# Patient Record
Sex: Male | Born: 1940
Health system: Southern US, Community
[De-identification: ages and names within clinical notes are randomized; demographics above are authoritative.]

## PROBLEM LIST (undated history)

## (undated) ENCOUNTER — Encounter: Attending: Internal Medicine | Primary: Internal Medicine

## (undated) ENCOUNTER — Encounter

## (undated) ENCOUNTER — Telehealth

## (undated) ENCOUNTER — Encounter: Attending: Registered" | Primary: Registered"

## (undated) ENCOUNTER — Ambulatory Visit: Payer: MEDICARE | Attending: Internal Medicine | Primary: Internal Medicine

## (undated) ENCOUNTER — Ambulatory Visit: Attending: Pharmacist | Primary: Pharmacist

## (undated) ENCOUNTER — Ambulatory Visit

## (undated) DIAGNOSIS — L570 Actinic keratosis: Secondary | ICD-10-CM

## (undated) DIAGNOSIS — I6529 Occlusion and stenosis of unspecified carotid artery: Secondary | ICD-10-CM

## (undated) DIAGNOSIS — K635 Polyp of colon: Secondary | ICD-10-CM

## (undated) DIAGNOSIS — E785 Hyperlipidemia, unspecified: Secondary | ICD-10-CM

## (undated) DIAGNOSIS — J449 Chronic obstructive pulmonary disease, unspecified: Secondary | ICD-10-CM

## (undated) DIAGNOSIS — M720 Palmar fascial fibromatosis [Dupuytren]: Secondary | ICD-10-CM

## (undated) DIAGNOSIS — R7303 Prediabetes: Secondary | ICD-10-CM

## (undated) DIAGNOSIS — R7302 Impaired glucose tolerance (oral): Secondary | ICD-10-CM

## (undated) DIAGNOSIS — I10 Essential (primary) hypertension: Secondary | ICD-10-CM

## (undated) DIAGNOSIS — I2510 Atherosclerotic heart disease of native coronary artery without angina pectoris: Secondary | ICD-10-CM

## (undated) DIAGNOSIS — C61 Malignant neoplasm of prostate: Secondary | ICD-10-CM

## (undated) DIAGNOSIS — J329 Chronic sinusitis, unspecified: Secondary | ICD-10-CM

## (undated) DIAGNOSIS — M1990 Unspecified osteoarthritis, unspecified site: Secondary | ICD-10-CM

## (undated) DIAGNOSIS — R0600 Dyspnea, unspecified: Secondary | ICD-10-CM

## (undated) DIAGNOSIS — H9190 Unspecified hearing loss, unspecified ear: Secondary | ICD-10-CM

## (undated) DIAGNOSIS — J309 Allergic rhinitis, unspecified: Secondary | ICD-10-CM

## (undated) DIAGNOSIS — E119 Type 2 diabetes mellitus without complications: Secondary | ICD-10-CM

## (undated) DIAGNOSIS — G629 Polyneuropathy, unspecified: Secondary | ICD-10-CM

## (undated) HISTORY — DX: Unspecified hearing loss, unspecified ear: H91.90

## (undated) HISTORY — DX: Chronic obstructive pulmonary disease, unspecified: J44.9

## (undated) HISTORY — PX: BACK SURGERY: SHX140

## (undated) HISTORY — DX: Unspecified osteoarthritis, unspecified site: M19.90

## (undated) HISTORY — DX: Polyneuropathy, unspecified: G62.9

## (undated) HISTORY — DX: Chronic sinusitis, unspecified: J32.9

## (undated) HISTORY — PX: KNEE SURGERY: SHX244

## (undated) HISTORY — PX: SHOULDER SURGERY: SHX246

## (undated) HISTORY — PX: TOOTH EXTRACTION: SUR596

## (undated) HISTORY — DX: Allergic rhinitis, unspecified: J30.9

## (undated) HISTORY — DX: Occlusion and stenosis of unspecified carotid artery: I65.29

## (undated) HISTORY — PX: NECK SURGERY: SHX720

## (undated) HISTORY — DX: Polyp of colon: K63.5

## (undated) HISTORY — DX: Essential (primary) hypertension: I10

## (undated) HISTORY — DX: Actinic keratosis: L57.0

## (undated) HISTORY — DX: Impaired glucose tolerance (oral): R73.02

## (undated) HISTORY — PX: PROSTATE SURGERY: SHX751

## (undated) HISTORY — DX: Hyperlipidemia, unspecified: E78.5

## (undated) HISTORY — DX: Malignant neoplasm of prostate: C61

---

## 1999-01-15 ENCOUNTER — Ambulatory Visit (HOSPITAL_COMMUNITY): Admission: RE | Admit: 1999-01-15 | Discharge: 1999-01-15 | Payer: Self-pay | Admitting: Gastroenterology

## 1999-01-15 ENCOUNTER — Encounter (INDEPENDENT_AMBULATORY_CARE_PROVIDER_SITE_OTHER): Payer: Self-pay

## 1999-08-01 ENCOUNTER — Encounter: Admission: RE | Admit: 1999-08-01 | Discharge: 1999-08-01 | Payer: Self-pay | Admitting: Family Medicine

## 1999-08-01 ENCOUNTER — Encounter: Payer: Self-pay | Admitting: Family Medicine

## 2000-09-05 ENCOUNTER — Ambulatory Visit (HOSPITAL_BASED_OUTPATIENT_CLINIC_OR_DEPARTMENT_OTHER): Admission: RE | Admit: 2000-09-05 | Discharge: 2000-09-05 | Payer: Self-pay | Admitting: Orthopedic Surgery

## 2002-06-30 ENCOUNTER — Encounter: Payer: Self-pay | Admitting: Neurosurgery

## 2002-06-30 ENCOUNTER — Observation Stay (HOSPITAL_COMMUNITY): Admission: RE | Admit: 2002-06-30 | Discharge: 2002-07-01 | Payer: Self-pay | Admitting: Neurosurgery

## 2003-05-30 ENCOUNTER — Encounter: Admission: RE | Admit: 2003-05-30 | Discharge: 2003-08-28 | Payer: Self-pay | Admitting: Family Medicine

## 2003-09-26 ENCOUNTER — Encounter (INDEPENDENT_AMBULATORY_CARE_PROVIDER_SITE_OTHER): Payer: Self-pay | Admitting: Specialist

## 2003-09-26 ENCOUNTER — Ambulatory Visit (HOSPITAL_COMMUNITY): Admission: RE | Admit: 2003-09-26 | Discharge: 2003-09-26 | Payer: Self-pay | Admitting: Gastroenterology

## 2003-12-01 ENCOUNTER — Ambulatory Visit (HOSPITAL_BASED_OUTPATIENT_CLINIC_OR_DEPARTMENT_OTHER): Admission: RE | Admit: 2003-12-01 | Discharge: 2003-12-01 | Payer: Self-pay | Admitting: Orthopedic Surgery

## 2004-12-15 ENCOUNTER — Encounter: Admission: RE | Admit: 2004-12-15 | Discharge: 2004-12-15 | Payer: Self-pay | Admitting: Neurosurgery

## 2008-03-31 ENCOUNTER — Ambulatory Visit (HOSPITAL_BASED_OUTPATIENT_CLINIC_OR_DEPARTMENT_OTHER): Admission: RE | Admit: 2008-03-31 | Discharge: 2008-03-31 | Payer: Self-pay | Admitting: Orthopedic Surgery

## 2008-06-24 DIAGNOSIS — C61 Malignant neoplasm of prostate: Secondary | ICD-10-CM

## 2008-06-24 HISTORY — DX: Malignant neoplasm of prostate: C61

## 2008-09-01 ENCOUNTER — Encounter: Admission: RE | Admit: 2008-09-01 | Discharge: 2008-09-01 | Payer: Self-pay | Admitting: Family Medicine

## 2008-12-07 ENCOUNTER — Ambulatory Visit (HOSPITAL_COMMUNITY): Admission: RE | Admit: 2008-12-07 | Discharge: 2008-12-07 | Payer: Self-pay | Admitting: Urology

## 2008-12-27 ENCOUNTER — Ambulatory Visit: Admission: RE | Admit: 2008-12-27 | Discharge: 2009-03-02 | Payer: Self-pay | Admitting: Radiation Oncology

## 2009-02-02 ENCOUNTER — Encounter (INDEPENDENT_AMBULATORY_CARE_PROVIDER_SITE_OTHER): Payer: Self-pay | Admitting: Urology

## 2009-02-02 ENCOUNTER — Inpatient Hospital Stay (HOSPITAL_COMMUNITY): Admission: RE | Admit: 2009-02-02 | Discharge: 2009-02-03 | Payer: Self-pay | Admitting: Urology

## 2010-09-29 LAB — BASIC METABOLIC PANEL
BUN: 19 mg/dL (ref 6–23)
CO2: 29 mEq/L (ref 19–32)
Calcium: 9.3 mg/dL (ref 8.4–10.5)
Creatinine, Ser: 0.98 mg/dL (ref 0.4–1.5)
GFR calc non Af Amer: >60 mL/min (ref >60)
Glucose, Bld: 101 mg/dL — ABNORMAL HIGH (ref 70–99)
Sodium: 139 mEq/L (ref 135–145)

## 2010-09-29 LAB — CBC
MCHC: 33.9 g/dL (ref 30.0–36.0)
Platelets: 235 10*3/uL (ref 150–400)
RDW: 12.7 % (ref 11.5–15.5)

## 2010-09-29 LAB — TYPE AND SCREEN: Antibody Screen: NEGATIVE

## 2010-11-06 NOTE — Discharge Summary (Signed)
NAMEBANDY, [name]               ACCOUNT NO.:  [id number]   MEDICAL RECORD NO.:  [id number]          PATIENT TYPE:  INP   LOCATION:  1404                         FACILITY:  [facility]   PHYSICIAN:  Heloise Purpura, MD      DATE OF BIRTH:  [birth year]   DATE OF ADMISSION:  02/02/2009  DATE OF DISCHARGE:  02/03/2009                               DISCHARGE SUMMARY   ADMISSION DIAGNOSIS:  Clinically localized adenocarcinoma of the  prostate.   DISCHARGE DIAGNOSIS:  Clinically localized adenocarcinoma of the  prostate (clinical stage [id number]).   PROCEDURES:  1. Robotic assisted laparoscopic radical prostatectomy (bilateral      nerve-sparing).  2. Bilateral pelvic lymphadenectomy.   History and physical for full details please see admission history and  physical.  Briefly, [name] is a 70 year old gentleman who was found  to have clinically localized adenocarcinoma of prostate.  After careful  consideration regarding management options for treatment he elected to  proceed with surgical therapy and a robotic assisted laparoscopic  radical prostatectomy.   HOSPITAL COURSE:  On February 02, 2009 he was taken to the operating room  and underwent the above-named procedures which he tolerated well without  complications.  Postoperatively he was able to be transferred to a  regular hospital room following recovery from anesthesia.  He was able  to begin ambulation that evening.  He remained hemodynamically stable.  His postoperative hematocrit was 36.7.  On the morning of postoperative  day #1 his hematocrit was also found to be stable at 34.8.  He  maintained excellent urine output with minimal output from his pelvic  drain. Therefore, the pelvic drain was removed.  He was placed on a  clear liquid diet and continued to ambulate.  He was reevaluated on the  afternoon of postoperative day #1.  He was able to tolerate his clear  liquid diet and his urine output was excellent and he had no  complaints  of pain.  Therefore, he was felt to be stable for discharge as he had  met all discharge criteria.   DISPOSITION:  Home.   DISCHARGE MEDICATIONS:  He was instructed to resume his regular home  medications consisting of Cymbalta, gabapentin, and pravastatin.  In  addition he was provided a prescription for Darvocet to use as needed  for pain and told to use Colace as a stool softener.  He was also  provided a prescription for Cipro to begin 1 day prior to removal of his  Foley catheter.   DISCHARGE INSTRUCTIONS:  He was instructed to be ambulatory but  specifically told to refrain from any heavy lifting, strenuous activity  or driving.  He was instructed on routine Foley catheter care and told  to gradually advance his diet over the course of the next few days.   FOLLOW-UP:  He will follow up in 1 week for removal of Foley catheter.      Delia Chimes, NP      Heloise Purpura, MD  Electronically Signed    MA/MEDQ  D:  02/03/2009  T:  02/03/2009  Job:  [id number]

## 2010-11-06 NOTE — Op Note (Signed)
[name], Danny               ACCOUNT NO.:  [id number]   MEDICAL RECORD NO.:  [id number]          PATIENT TYPE:  AMB   LOCATION:  DSC                          FACILITY:  [facility]   PHYSICIAN:  Katy Fitch. Sypher, M.D. DATE OF BIRTH:  1941/02/26   DATE OF PROCEDURE:  DATE OF DISCHARGE:                               OPERATIVE REPORT   PREOPERATIVE DIAGNOSES:  1. Dupuytren contracture, left palm involving pretendinous fibers to      long finger, ring finger, and 2 natatory ligament cords in the      thumb index webspace.  2. Stenosing tenosynovitis of index, long, ring, and small fingers.   POSTOPERATIVE DIAGNOSES:  1. Dupuytren contracture, left palm involving pretendinous fibers to      long finger, ring finger, and 2 natatory ligament cords in the      thumb index webspace.  2. Stenosing tenosynovitis of index, long, ring, and small fingers.   OPERATIONS:  1. His resection of Dupuytren palmar fibromatosis contracture of left      long finger and palm.  2. Resection of Dupuytren contracture, left ring finger and palm.  3. A needle aponeurotomy to release a pair of natatory ligament;      contracture cords; thumb index webspace, left palm.  4. Release of left index finger A1 pulley.  5. Release of left long finger A1 pulley.  6. Release of left ring finger A1 pulley with synovectomy of flexor      tendons.  7. Injection of Depo-Medrol and lidocaine into distal flexor sheath of      ring finger due to distal stenosing tenosynovitis beneath C1 and A2      pulleys.  8. Release of left small finger A1 pulley.   OPERATING SURGEON:  Katy Fitch. Sypher, MD   ASSISTANT:  Danny Reeks Dasnoit, PA-C   ANESTHESIA:  Left infraclavicular block supplemented by IV sedation.   SUPERVISING ANESTHESIOLOGIST:  Zenon Mayo, MD   INDICATIONS:  [name] is a 70 year old retired gentleman referred  through the courtesy of Dr. Catha Gosselin for evaluation of Dupuytren  contracture and  multiple trigger fingers.  He has failed nonoperative  measures.   [name] has a severe Dupuytren diathesis with nodular fibromatosis in  the palm.  He had a thumb index web contracture due to natatory ligament  bands and extensive nodular disease in the pretendinous fibers of the  long and ring fingers.  In addition, he had active triggering of all of  his fingers of the left hand.   Due to a failure to respond to nonoperative measures.  He was brought to  the operating at this time for release of his A1 pulleys of all fingers  and correction of his nodular palmar fibromatosis.   Due to the magnitude of his nodules at the long and ring fingers, we  will resect these through our incisions to release the A1 pulleys.  In  the thumb index web space, we will dress his cord with natatory ligament  release with needle aponeurotomy.  Preoperatively, he was advised the  potential risks and  benefits of surgery.  He understands that with any  dissection in this region, he has a small possibility of a neurovascular  injury.  We will vigorously protect his neurovascular structures  throughout the procedure.   After informed consent, he was brought to the operating at this time.  He does understand that Dupuytren disease is progressive and will recur  overtime.   PROCEDURE:  [name] was brought to the operating room and placed  in supine position up on the operating room table.   Following an anesthesia consult by Dr. Sampson Goon, a left  infraclavicular block was placed without complication.   [name] was brought to room 6, placed in supine position up on the  operating room table and in Dr. Jarrett Ables direct supervision, IV  sedation provided.   The left arm was prepped with Betadine soap solution and sterilely  draped.  A pneumatic tourniquet was applied proximal to brachium.   On exsanguination of left arm with Esmarch bandage, the arterial  tourniquet was inflated to 220  mmHg.  The procedure commenced with use  of an 18-gauge needle to release a pair of tight cords and natatory  ligament.  A total of 3 punctures were utilized to sequentially  segmentally release the cord.  This completely relieved the tented  contracture.   The A1 pulleys of the index, long ring, and small fingers were dressed  through an individual oblique incisions paralleling the distal palmar  crease.  Subcutaneous tissues were meticulously dissected removing the  pretendinous fibers to each finger.  For the long and ring fingers, very  large nodules of fibromatosis were circumferentially dissected deep to  the dermis, taking care to protect the neurovascular bundles.  After  these were excised.  The pitting of the palm was relieved.  The A1  pulleys of the index long ring and small fingers were invested in dense  fibrotic and edematous inflammatory tissue.  This was cleared with a  Glorious Peach followed by release of the pulleys with scissors.  The flexor  tendon was delivered.  The index and long tendons were edematous, but  otherwise normal.  The ring finger flexor tendon had profound synovitis  that was relieved with scissors and rongeur dissection.  After  synovectomy from the level of the mid palm to the A2 pulley, there was  still triggering between the A2 pulley and the C1 pulley.  Rather than  making a second incision and resecting one of the flips of the  superficialis.  I elected to thread a 22-gauge needle deep to the A2  pulley and inject cortisone distally in the flexor sheath.   We will attempt to treat this distal triggering phenomenon with anti-  inflammatory medication initially.   In the small finger, the synovitis was less prominent.  The pulley was  released and full passive motion recovered.   After hemostasis achieved, all wounds were repaired with intradermal  through a Prolene.  A compressive dressing was applied with Xeroflo  sterile gauze and an Ace  bandage.   We will encourage [name] to begin immediate range of motion exercise  once block wears off.   I will see him back and follow up in the office for interval follow up  in 1 week or sooner if any problems.      Katy Fitch Sypher, M.D.  Electronically Signed     RVS/MEDQ  D:  03/31/2008  T:  04/01/2008  Job:  [id number]   cc:  Anna Genre Little, M.D.

## 2010-11-06 NOTE — Op Note (Signed)
[name], [name]               ACCOUNT NO.:  [id number]   MEDICAL RECORD NO.:  [id number]          PATIENT TYPE:  INP   LOCATION:  1404                         FACILITY:  [facility]   PHYSICIAN:  Heloise Purpura, MD      DATE OF BIRTH:  [birth year]   DATE OF PROCEDURE:  02/02/2009  DATE OF DISCHARGE:                               OPERATIVE REPORT   PREOPERATIVE DIAGNOSIS:  Clinically localized adenocarcinoma of the  prostate (cT1c Nx Mx)   POSTOPERATIVE DIAGNOSIS:  Clinically localized adenocarcinoma of the  prostate (clinical stage T1c NX MX).   PROCEDURE:  1. Robotic-assisted laparoscopic radical prostatectomy (bilateral      nerve sparing).  2. Bilateral pelvic lymphadenectomy.   SURGEON:  Dr. Heloise Purpura.   ASSISTANT:  Delia Chimes, nurse practitioner.   ANESTHESIA:  General.   COMPLICATIONS:  None.   ESTIMATED BLOOD LOSS:  150 mL.   SPECIMENS:  1. Prostate and seminal vesicles.  2. Bladder neck margin.  3. Right pelvic lymph nodes.  4. Left pelvic lymph nodes.   DISPOSITION:  Specimen to pathology.   DRAINS:  1. 20-French coude catheter.  2. #19 Blake pelvic drain.   INDICATIONS:  [name] is a 70 year old gentleman with clinically  localized adenocarcinoma of the prostate.  After a thorough discussion  regarding management options for treatment, he elected to proceed with  surgical therapy and the above procedure.  The potential risks,  complications, and alternative treatment options were discussed in  detail and informed consent was obtained.   DESCRIPTION OF PROCEDURE:  The patient was taken to the operating room  and a general anesthetic was administered.  He was given preoperative  antibiotics, placed in the dorsal lithotomy position, and prepped and  draped in the usual sterile fashion.  Next, a preoperative time-out was  performed.  A Foley catheter was then inserted into the bladder and a  site was selected just above the umbilicus in the midline  for placement  of the camera port.  This was placed using a standard open Hasson  technique, which allowed entry into the peritoneal cavity under direct  vision and without difficulty.  A 12-mm port was then placed and a  pneumoperitoneum was established.  With the 0 degree lens, the abdomen  was inspected and there was no evidence for any intra-abdominal injuries  or other abnormalities.  The remaining ports were then placed with 8-mm  robotic ports placed in the right lower quadrant, left lower quadrant,  and far left lower quadrant.  A 5-mm port was placed between the camera  port and the right robotic port and a 12-mm port was placed in the far  right lateral abdominal wall for laparoscopic assistance.  All ports  were placed under direct vision and without difficulty.  The surgical  cart was then docked.  With the aid of the cautery scissors, the bladder  was reflected posteriorly to allow entry into the space of Retzius and  identification of the endopelvic fascia and prostate.  The endopelvic  fascia was then incised in the apex back to the base  of the prostate  bilaterally and the underlying levator muscle fibers were swept  laterally off the prostate, thereby isolating the dorsal vein complex  which was then stapled and divided with a 45-mm Flex-ETS stapler.  The  bladder neck was identified with the aid of Foley catheter manipulation  and was divided serially, thereby exposing the Foley catheter.  A  catheter balloon was deflated and the catheter was brought into the  operative field and used to retract the prostate anteriorly.  This then  exposed the posterior bladder neck which was divided and dissection  proceeded between the prostate and bladder until the vasa deferentia and  seminal vesicles were identified.  The vasa deferentia were isolated,  divided and lifted anteriorly and the seminal vesicles were dissected  down to their tips with care to control the seminal vesicle  arterial  blood supply and then lifted anteriorly.  The space between  Denonvilliers fascia and the anterior rectum was then bluntly developed,  thereby isolating the vascular pedicles of the prostate.  The lateral  prostatic fascia was incised sharply bilaterally, allowing the  neurovascular bundles to be released and the vascular pedicles of the  prostate were then ligated with Hem-o-lok clips above the level of the  neurovascular bundles and divided with sharp cold scissor dissection.  The neurovascular bundles were then swept off the apex of the prostate  and urethra and then urethra was sharply transected.  This allowed the  specimen to be disarticulated.  Examination of the bladder neck did  reveal some hypertrophic tissue vs possible prostate tissue.  This was,  therefore, excised and sent as a separate bladder neck margin.  The  pelvis was then copiously irrigated and hemostasis was ensured.  There  was no evidence for a rectal injury.  Attention then turned to the right  pelvic sidewall.  The fibrofatty tissue between the external iliac vein,  confluence of the iliac vessels, hypogastric artery, and Cooper's  ligament was dissected free from the pelvic sidewall with care to  preserve the obturator nerve.  Hem-o-lok clips were used for  lymphostasis and hemostasis.  An identical procedure was then performed  on the contralateral side.  Both lymphatic packets were passed off for  permanent pathologic analysis.  Attention then turned to the urethral  anastomosis.  A 2-0 Vicryl slip-knot was placed at the 6 o'clock  position between Denonvilliers fascia, the posterior bladder neck, and  the posterior urethra to reapproximate these structures.  A double-armed  3-0 Monocryl suture was then used to provide tension-free anastomosis of  the bladder neck and urethra.  A new 20-French coude catheter was then  inserted into the bladder and irrigated.  There were no blood clots  within the  bladder and the anastomosis appeared to be watertight.  A #  19 Blake drain was then brought through the left robotic port,  appropriately positioned in the pelvis.  It was secured to the skin with  a nylon suture.  The surgical cart was then undocked.  The right lateral  12-mm port site was closed with a 0 Vicryl suture placed  laparoscopically and all remaining ports were removed under direct  vision.  The prostate specimen was removed intact within the Endopouch  retrieval bag via the periumbilical port site and this fascial opening  was then closed with a running 0 Vicryl suture.  All port sites were  injected with 0.25% Marcaine and reapproximated at the skin level with  staples.  Sterile  dressings were applied.  The patient appeared to  tolerate the procedure well and without complications.  He was able to  be extubated and transferred to the recovery unit in satisfactory  condition.      Heloise Purpura, MD  Electronically Signed     LB/MEDQ  D:  02/02/2009  T:  02/02/2009  Job:  [id number]

## 2010-11-09 NOTE — Op Note (Signed)
NAME:  [name], [name]                         ACCOUNT NO.:  [id number]   MEDICAL RECORD NO.:  [id number]                   PATIENT TYPE:  AMB   LOCATION:  ENDO                                 FACILITY:  [facility]   PHYSICIAN:  John C. Madilyn Fireman, M.D.                 DATE OF BIRTH:  Dec 24, 1940   DATE OF PROCEDURE:  09/26/2003  DATE OF DISCHARGE:                                 OPERATIVE REPORT   PROCEDURE:  Colonoscopy with polypectomy.   INDICATIONS FOR PROCEDURE:  History of adenomatous colon polyps.   DESCRIPTION OF PROCEDURE:  The patient was placed in the left lateral  decubitus position and placed on the pulse monitor with continuous low-flow  oxygen delivered by nasal cannula.  He was sedated with 75 mcg IV fentanyl  and 8 mg IV Versed.  The Olympus video colonoscope was inserted into the  rectum and advanced to the cecum, confirmed by transillumination at  McBurney's point and visualization of the ileocecal valve and appendiceal  orifice.  The prep was excellent.  The cecum, ascending, transverse,  descending, and sigmoid colon all appeared normal with no masses, polyps,  diverticula, or other mucosal abnormalities.  Within the sigmoid colon, no  abnormalities were seen.  In the proximal rectum, there were 3-4 small,  somewhat translucent sessile polyps, ranging from 3-7 mm, and the larger of  these were hot biopsied.  They had the appearance consistent with  hyperplastic polyp.  The remainder of rectum appeared normal.  The scope was  then withdrawn, and the patient returned to the recovery room in stable  condition.  He tolerated the procedure well, and there were no immediate  complications.   IMPRESSION:  Small rectal polyps, otherwise normal colonoscopy.   PLAN:  Await pathology but will probably repeat colonoscopy in 5 years.                                               John C. Madilyn Fireman, M.D.    JCH/MEDQ  D:  09/26/2003  T:  09/26/2003  Job:  [id number]   cc:   Caryn Bee L.  Little, M.D.  [address]  [place]  [place] [address]  Fax: [phone]

## 2010-11-09 NOTE — Op Note (Signed)
NAME:  [name], [name]                         ACCOUNT NO.:  [id number]   MEDICAL RECORD NO.:  [id number]                   PATIENT TYPE:  AMB   LOCATION:  DSC                                  FACILITY:  [facility]   PHYSICIAN:  Loreta Ave, M.D.              DATE OF BIRTH:  1940-08-02   DATE OF PROCEDURE:  12/01/2003  DATE OF DISCHARGE:                                 OPERATIVE REPORT   PREOPERATIVE DIAGNOSIS:  Chronic impingement left shoulder with marked  degenerative joint disease acromioclavicular joint, anterior labral cyst,  partial versus complete rotator cuff tear.   POSTOPERATIVE DIAGNOSIS:  Chronic impingement left shoulder with marked  degenerative joint disease acromioclavicular joint, anterior labral tear and  cyst, partial but no full thickness tear rotator cuff.   OPERATIVE PROCEDURE:  Left shoulder exam under anesthesia, arthroscopy,  debridement of paralabral cyst and labral tear, debridement of rotator cuff  above and below, acromioplasty with coracoacromial ligament release,  excision distal clavicle.   SURGEON:  Loreta Ave, M.D.   ASSISTANT:  Arlys John D. Petrarca, P.A.-C.   ANESTHESIA:  General.   ESTIMATED BLOOD LOSS:  Minimal.   SPECIMENS:  None.   CULTURES:  None.   COMPLICATIONS:  None.   DRESSINGS:  Soft compressive with a sling.   PROCEDURE:  The patient was brought to the operating room and placed on the  operating table in supine position.  After adequate anesthesia had been  obtained, the left shoulder was examined.  Full motion and good stability.  The patient was placed in a beach chair position on the shoulder positioner,  prepped and draped in the usual sterile fashion.  Three standard portals,  anterior, posterior, and lateral.  The shoulder was entered with a blunt  obturator, distended, and inspected.  Anterior labral tear 2/3 of the way  down the front aspect extending into a small cyst in front of the labrum  with inflammatory  tissue there.  All of that was debrided including the  labral tear.  The pocket where the cyst was was decompressed excising tissue  around this.  This appeared to be a cyst forming from the labral tear  extending into the cyst.  Both were well decompressed.  The remaining  articular cartilage in the shoulder looked good.  Thinning and attrition on  the bottom of the cuff crescent region supraspinatus tendon but no full  thickness tears.  Biceps tendon, biceps anchor, capsular ligamentous  structures intact.  Cannula redirected subacromial. Class chronic  impingement, reactive bursitis, type 2 acromion.  More impingement from  grade 4 changes and DJD AC joint.  Bursa resected, cuff debrided, and  thoroughly inspected.  No full thickness tears that would warrant open  intervention.  Acromioplasty to a type 1 acromion with shaver and a high  speed bur.  CA ligament released with cautery.  Distal clavicle grade 4  changes.  Periarticular spurs  and lateral cm of clavicle resected.  Accuracy  of decompression and clavicle excision confirmed viewing from all portals.  Instruments and fluid removed.  The portals and bursa injected with  Marcaine.  The portals were closed with 4-0 nylon.  Sterile compressive  dressing applied.  Anesthesia reversed.  Brought to the recovery room.  Tolerated the surgery well without complications.                                               Loreta Ave, M.D.    DFM/MEDQ  D:  12/01/2003  T:  12/01/2003  Job:  [id number]

## 2010-11-09 NOTE — Op Note (Signed)
NAME:  [name], [name]                         ACCOUNT NO.:  [id number]   MEDICAL RECORD NO.:  [id number]                   PATIENT TYPE:  INP   LOCATION:  3172                                 FACILITY:  [facility]   PHYSICIAN:  Hewitt Shorts, M.D.            DATE OF BIRTH:  April 04, 1941   DATE OF PROCEDURE:  06/30/2002  DATE OF DISCHARGE:                                 OPERATIVE REPORT   PREOPERATIVE DIAGNOSIS:  Left C7-T1 cervical disc herniation.   POSTOPERATIVE DIAGNOSIS:  Left C7-T1 cervical disc herniation.   OPERATION:  Left C7-T1 posterior cervical laminotomy, foraminotomy and  microdiskectomy with microdissection.   SURGEON:  Hewitt Shorts, M.D.   ASSESSMENT:  Payton Doughty, M.D.   ANESTHESIA:  General endotracheal   INDICATIONS FOR PROCEDURE:  The patient is a 70 year old man who presented  with left hand weakness and atrophy consistent with a left C8 cervical  radiculopathy.  MRI scan revealed a left C7-T1 foraminal disc herniation.  Decision was made to proceed with posterior cervical laminotomy,  foraminotomy and microdiskectomy.   DESCRIPTION OF PROCEDURE:  The patient was brought to the operating room,  placed under general endotracheal anesthesia.  The patient was placed in  head holder and was brought to a sitting position.  A central line had been  placed prior to anesthesia by the anesthesia service and the patient was  monitored with a precordial Doppler throughout the procedure.  No air  embolisms were noted throughout the procedure.  The posterior cervical  region was prepped with Betadine soaked solution and draped in sterile  fashion.  The midline incision was infiltrated with local anesthetic with  epinephrine and then a vertical midline incision was made over the C7-T1  level. Bipolar cautery and electrocautery used to maintain used to maintain  hemostasis.  Dissection was carried down to the posterior cervical fascia  which was incised on the  left side of the midline and then the paraspinal  muscle dissection from the spinous process and lamina in a subperiosteal  fashion.  An x-ray was taken and the C7-T1 level identified.  Self retaining  retractor was placed and then a laminotomy was performed using Kerrison  punches.  The microscope was draped and brought into the field to provide  instant magnification and lamination and visualization.  The remainder of  the procedure was performed using microdissection and microsurgical  technique.  The laminotomy was extended laterally.  A foraminotomy was  performed.  Resected the lateral margin of the ligamentum flavum and were  able to identify the lateral margin of the thecal sac and the left C8 nerve  roots.  We then examined ventral to the nerve root.  We then examined  ventral to the nerve roots.  Disc herniation was identified and the  remaining ligament fibers overlying it were incised and the fragment  extruded.  The fragment itself was removed in a  piece meal fashion,  carefully examining laterally within the foramen where additional portions  of the disc herniation were found and were able to be mobilized and removed.  In the end all loose fragment of disc material were removed with good  decompression of the thecal sac and nerve roots.  Hemostasis was established  with the use of bipolar cautery.  The edges of the bone were waxed.  Once  hemostasis was established and confirmed, the wound was irrigated with  Bacitracin solution and then we proceeded with closure.  The deep fascia was  closed with interrupted undyed 0 and 2-0 undyed Vicryl suture.  The  subcutaneous and subcuticular were closed with inverted 2-0 undyed Vicryl  sutures and the skin edges were approximated with Dermabond.  The patient  tolerated the procedure well.  The estimated  blood loss was less than 10 cc.  Sponge and needle counts were correct.  Following surgery, the patient was brought back down to a  supine position.  The head holder was removed and the patient is to be reversed from  anesthetic, extubated and subsequently transferred to the recovery room for  further care.                                               Hewitt Shorts, M.D.    RWN/MEDQ  D:  06/30/2002  T:  06/30/2002  Job:  [id number]

## 2011-03-25 LAB — POCT HEMOGLOBIN-HEMACUE: Hemoglobin: 15.1

## 2011-06-03 ENCOUNTER — Other Ambulatory Visit: Payer: Self-pay | Admitting: Family Medicine

## 2011-06-03 DIAGNOSIS — M21371 Foot drop, right foot: Secondary | ICD-10-CM

## 2011-06-04 ENCOUNTER — Ambulatory Visit
Admission: RE | Admit: 2011-06-04 | Discharge: 2011-06-04 | Disposition: A | Discharge status: Home / Self Care | Payer: Medicare Other | Source: Ambulatory Visit | Attending: Family Medicine | Admitting: Family Medicine

## 2011-06-04 DIAGNOSIS — M21371 Foot drop, right foot: Secondary | ICD-10-CM

## 2011-06-27 DIAGNOSIS — IMO0002 Reserved for concepts with insufficient information to code with codable children: Secondary | ICD-10-CM | POA: Diagnosis not present

## 2011-07-05 DIAGNOSIS — M5126 Other intervertebral disc displacement, lumbar region: Secondary | ICD-10-CM | POA: Diagnosis not present

## 2011-07-05 DIAGNOSIS — M47817 Spondylosis without myelopathy or radiculopathy, lumbosacral region: Secondary | ICD-10-CM | POA: Diagnosis not present

## 2011-07-25 DIAGNOSIS — H903 Sensorineural hearing loss, bilateral: Secondary | ICD-10-CM | POA: Diagnosis not present

## 2011-07-29 ENCOUNTER — Other Ambulatory Visit: Payer: Self-pay | Admitting: Family Medicine

## 2011-07-29 DIAGNOSIS — R05 Cough: Secondary | ICD-10-CM

## 2011-07-30 ENCOUNTER — Ambulatory Visit
Admission: RE | Admit: 2011-07-30 | Discharge: 2011-07-30 | Disposition: A | Discharge status: Home / Self Care | Payer: Medicare Other | Source: Ambulatory Visit | Attending: Family Medicine | Admitting: Family Medicine

## 2011-07-30 DIAGNOSIS — R053 Chronic cough: Secondary | ICD-10-CM

## 2011-07-30 DIAGNOSIS — J329 Chronic sinusitis, unspecified: Secondary | ICD-10-CM | POA: Diagnosis not present

## 2011-07-30 DIAGNOSIS — R05 Cough: Secondary | ICD-10-CM | POA: Diagnosis not present

## 2011-07-30 DIAGNOSIS — G458 Other transient cerebral ischemic attacks and related syndromes: Secondary | ICD-10-CM | POA: Diagnosis not present

## 2011-07-30 DIAGNOSIS — J3489 Other specified disorders of nose and nasal sinuses: Secondary | ICD-10-CM | POA: Diagnosis not present

## 2011-07-30 DIAGNOSIS — I10 Essential (primary) hypertension: Secondary | ICD-10-CM | POA: Diagnosis not present

## 2011-07-31 ENCOUNTER — Other Ambulatory Visit: Payer: Medicare Other

## 2011-08-02 DIAGNOSIS — J329 Chronic sinusitis, unspecified: Secondary | ICD-10-CM | POA: Diagnosis not present

## 2011-08-09 DIAGNOSIS — C61 Malignant neoplasm of prostate: Secondary | ICD-10-CM | POA: Diagnosis not present

## 2011-08-23 DIAGNOSIS — J329 Chronic sinusitis, unspecified: Secondary | ICD-10-CM | POA: Diagnosis not present

## 2011-08-29 DIAGNOSIS — IMO0002 Reserved for concepts with insufficient information to code with codable children: Secondary | ICD-10-CM | POA: Diagnosis not present

## 2011-09-04 DIAGNOSIS — N529 Male erectile dysfunction, unspecified: Secondary | ICD-10-CM | POA: Diagnosis not present

## 2011-09-04 DIAGNOSIS — Z8546 Personal history of malignant neoplasm of prostate: Secondary | ICD-10-CM | POA: Diagnosis not present

## 2011-09-06 DIAGNOSIS — M47817 Spondylosis without myelopathy or radiculopathy, lumbosacral region: Secondary | ICD-10-CM | POA: Diagnosis not present

## 2011-09-06 DIAGNOSIS — M5126 Other intervertebral disc displacement, lumbar region: Secondary | ICD-10-CM | POA: Diagnosis not present

## 2011-09-06 DIAGNOSIS — IMO0002 Reserved for concepts with insufficient information to code with codable children: Secondary | ICD-10-CM | POA: Diagnosis not present

## 2011-09-06 DIAGNOSIS — M79609 Pain in unspecified limb: Secondary | ICD-10-CM | POA: Diagnosis not present

## 2011-09-30 DIAGNOSIS — Z86010 Personal history of colonic polyps: Secondary | ICD-10-CM | POA: Diagnosis not present

## 2011-09-30 DIAGNOSIS — J449 Chronic obstructive pulmonary disease, unspecified: Secondary | ICD-10-CM | POA: Diagnosis not present

## 2011-09-30 DIAGNOSIS — Z8546 Personal history of malignant neoplasm of prostate: Secondary | ICD-10-CM | POA: Diagnosis not present

## 2011-09-30 DIAGNOSIS — Z0000 Encounter for general adult medical examination without abnormal findings: Secondary | ICD-10-CM | POA: Diagnosis not present

## 2011-09-30 DIAGNOSIS — R7301 Impaired fasting glucose: Secondary | ICD-10-CM | POA: Diagnosis not present

## 2011-09-30 DIAGNOSIS — L570 Actinic keratosis: Secondary | ICD-10-CM | POA: Diagnosis not present

## 2011-09-30 DIAGNOSIS — M545 Low back pain: Secondary | ICD-10-CM | POA: Diagnosis not present

## 2011-09-30 DIAGNOSIS — I10 Essential (primary) hypertension: Secondary | ICD-10-CM | POA: Diagnosis not present

## 2011-10-18 DIAGNOSIS — M47817 Spondylosis without myelopathy or radiculopathy, lumbosacral region: Secondary | ICD-10-CM | POA: Diagnosis not present

## 2011-10-18 DIAGNOSIS — M4600 Spinal enthesopathy, site unspecified: Secondary | ICD-10-CM | POA: Diagnosis not present

## 2011-10-18 DIAGNOSIS — M5126 Other intervertebral disc displacement, lumbar region: Secondary | ICD-10-CM | POA: Diagnosis not present

## 2011-10-18 DIAGNOSIS — M4310 Spondylolisthesis, site unspecified: Secondary | ICD-10-CM | POA: Diagnosis not present

## 2011-10-18 DIAGNOSIS — IMO0002 Reserved for concepts with insufficient information to code with codable children: Secondary | ICD-10-CM | POA: Diagnosis not present

## 2011-10-18 DIAGNOSIS — IMO0001 Reserved for inherently not codable concepts without codable children: Secondary | ICD-10-CM | POA: Diagnosis not present

## 2011-11-28 DIAGNOSIS — IMO0002 Reserved for concepts with insufficient information to code with codable children: Secondary | ICD-10-CM | POA: Diagnosis not present

## 2011-12-10 DIAGNOSIS — M545 Low back pain, unspecified: Secondary | ICD-10-CM | POA: Diagnosis not present

## 2011-12-10 DIAGNOSIS — M47817 Spondylosis without myelopathy or radiculopathy, lumbosacral region: Secondary | ICD-10-CM | POA: Diagnosis not present

## 2011-12-10 DIAGNOSIS — M546 Pain in thoracic spine: Secondary | ICD-10-CM | POA: Diagnosis not present

## 2011-12-10 DIAGNOSIS — M4310 Spondylolisthesis, site unspecified: Secondary | ICD-10-CM | POA: Diagnosis not present

## 2011-12-10 DIAGNOSIS — IMO0002 Reserved for concepts with insufficient information to code with codable children: Secondary | ICD-10-CM | POA: Diagnosis not present

## 2011-12-10 DIAGNOSIS — M5126 Other intervertebral disc displacement, lumbar region: Secondary | ICD-10-CM | POA: Diagnosis not present

## 2012-02-11 DIAGNOSIS — D239 Other benign neoplasm of skin, unspecified: Secondary | ICD-10-CM | POA: Diagnosis not present

## 2012-02-11 DIAGNOSIS — Z85828 Personal history of other malignant neoplasm of skin: Secondary | ICD-10-CM | POA: Diagnosis not present

## 2012-02-11 DIAGNOSIS — L719 Rosacea, unspecified: Secondary | ICD-10-CM | POA: Diagnosis not present

## 2012-02-11 DIAGNOSIS — L570 Actinic keratosis: Secondary | ICD-10-CM | POA: Diagnosis not present

## 2012-02-11 DIAGNOSIS — L819 Disorder of pigmentation, unspecified: Secondary | ICD-10-CM | POA: Diagnosis not present

## 2012-02-11 DIAGNOSIS — D1801 Hemangioma of skin and subcutaneous tissue: Secondary | ICD-10-CM | POA: Diagnosis not present

## 2012-02-11 DIAGNOSIS — L821 Other seborrheic keratosis: Secondary | ICD-10-CM | POA: Diagnosis not present

## 2012-02-26 DIAGNOSIS — C61 Malignant neoplasm of prostate: Secondary | ICD-10-CM | POA: Diagnosis not present

## 2012-03-04 DIAGNOSIS — N529 Male erectile dysfunction, unspecified: Secondary | ICD-10-CM | POA: Diagnosis not present

## 2012-03-04 DIAGNOSIS — C61 Malignant neoplasm of prostate: Secondary | ICD-10-CM | POA: Diagnosis not present

## 2012-04-07 DIAGNOSIS — Z23 Encounter for immunization: Secondary | ICD-10-CM | POA: Diagnosis not present

## 2012-05-28 DIAGNOSIS — IMO0002 Reserved for concepts with insufficient information to code with codable children: Secondary | ICD-10-CM | POA: Diagnosis not present

## 2012-05-28 DIAGNOSIS — M542 Cervicalgia: Secondary | ICD-10-CM | POA: Diagnosis not present

## 2012-05-28 DIAGNOSIS — M79609 Pain in unspecified limb: Secondary | ICD-10-CM | POA: Diagnosis not present

## 2012-05-28 DIAGNOSIS — M47817 Spondylosis without myelopathy or radiculopathy, lumbosacral region: Secondary | ICD-10-CM | POA: Diagnosis not present

## 2012-06-24 DIAGNOSIS — I6529 Occlusion and stenosis of unspecified carotid artery: Secondary | ICD-10-CM

## 2012-06-24 HISTORY — DX: Occlusion and stenosis of unspecified carotid artery: I65.29

## 2012-07-06 DIAGNOSIS — L03039 Cellulitis of unspecified toe: Secondary | ICD-10-CM | POA: Diagnosis not present

## 2012-07-28 DIAGNOSIS — G458 Other transient cerebral ischemic attacks and related syndromes: Secondary | ICD-10-CM | POA: Diagnosis not present

## 2012-07-28 DIAGNOSIS — I10 Essential (primary) hypertension: Secondary | ICD-10-CM | POA: Diagnosis not present

## 2012-07-28 DIAGNOSIS — E782 Mixed hyperlipidemia: Secondary | ICD-10-CM | POA: Diagnosis not present

## 2012-08-21 DIAGNOSIS — R0989 Other specified symptoms and signs involving the circulatory and respiratory systems: Secondary | ICD-10-CM | POA: Diagnosis not present

## 2012-08-26 DIAGNOSIS — C61 Malignant neoplasm of prostate: Secondary | ICD-10-CM | POA: Diagnosis not present

## 2012-09-02 DIAGNOSIS — C61 Malignant neoplasm of prostate: Secondary | ICD-10-CM | POA: Diagnosis not present

## 2012-09-21 DIAGNOSIS — G5600 Carpal tunnel syndrome, unspecified upper limb: Secondary | ICD-10-CM | POA: Diagnosis not present

## 2012-09-30 DIAGNOSIS — Z1331 Encounter for screening for depression: Secondary | ICD-10-CM | POA: Diagnosis not present

## 2012-09-30 DIAGNOSIS — G458 Other transient cerebral ischemic attacks and related syndromes: Secondary | ICD-10-CM | POA: Diagnosis not present

## 2012-09-30 DIAGNOSIS — E782 Mixed hyperlipidemia: Secondary | ICD-10-CM | POA: Diagnosis not present

## 2012-09-30 DIAGNOSIS — Z86010 Personal history of colonic polyps: Secondary | ICD-10-CM | POA: Diagnosis not present

## 2012-09-30 DIAGNOSIS — Z8546 Personal history of malignant neoplasm of prostate: Secondary | ICD-10-CM | POA: Diagnosis not present

## 2012-09-30 DIAGNOSIS — R7301 Impaired fasting glucose: Secondary | ICD-10-CM | POA: Diagnosis not present

## 2012-09-30 DIAGNOSIS — Z0000 Encounter for general adult medical examination without abnormal findings: Secondary | ICD-10-CM | POA: Diagnosis not present

## 2012-09-30 DIAGNOSIS — I10 Essential (primary) hypertension: Secondary | ICD-10-CM | POA: Diagnosis not present

## 2012-11-09 DIAGNOSIS — J329 Chronic sinusitis, unspecified: Secondary | ICD-10-CM | POA: Diagnosis not present

## 2012-11-25 DIAGNOSIS — J329 Chronic sinusitis, unspecified: Secondary | ICD-10-CM | POA: Diagnosis not present

## 2012-11-25 DIAGNOSIS — J3089 Other allergic rhinitis: Secondary | ICD-10-CM | POA: Diagnosis not present

## 2012-12-24 DIAGNOSIS — IMO0002 Reserved for concepts with insufficient information to code with codable children: Secondary | ICD-10-CM | POA: Diagnosis not present

## 2012-12-24 DIAGNOSIS — M79609 Pain in unspecified limb: Secondary | ICD-10-CM | POA: Diagnosis not present

## 2012-12-24 DIAGNOSIS — M47817 Spondylosis without myelopathy or radiculopathy, lumbosacral region: Secondary | ICD-10-CM | POA: Diagnosis not present

## 2012-12-24 DIAGNOSIS — M542 Cervicalgia: Secondary | ICD-10-CM | POA: Diagnosis not present

## 2012-12-31 ENCOUNTER — Institutional Professional Consult (permissible substitution): Payer: Medicare Other | Admitting: Internal Medicine

## 2013-01-07 ENCOUNTER — Encounter: Payer: Self-pay | Admitting: Internal Medicine

## 2013-01-08 ENCOUNTER — Ambulatory Visit (INDEPENDENT_AMBULATORY_CARE_PROVIDER_SITE_OTHER): Payer: Medicare Other | Admitting: Internal Medicine

## 2013-01-08 ENCOUNTER — Encounter: Payer: Self-pay | Admitting: Internal Medicine

## 2013-01-08 ENCOUNTER — Ambulatory Visit (INDEPENDENT_AMBULATORY_CARE_PROVIDER_SITE_OTHER)
Admission: RE | Admit: 2013-01-08 | Discharge: 2013-01-08 | Disposition: A | Discharge status: Home / Self Care | Payer: Medicare Other | Source: Ambulatory Visit | Attending: Internal Medicine | Admitting: Internal Medicine

## 2013-01-08 VITALS — BP 136/60 | HR 72 | Temp 98.1°F | Ht 68.0 in | Wt 197.0 lb

## 2013-01-08 DIAGNOSIS — R05 Cough: Secondary | ICD-10-CM

## 2013-01-08 DIAGNOSIS — R059 Cough, unspecified: Secondary | ICD-10-CM

## 2013-01-08 MED ORDER — PANTOPRAZOLE SODIUM 40 MG PO TBEC: 40.0000 mg | DELAYED_RELEASE_TABLET | Freq: Every day | ORAL | Status: DC

## 2013-01-08 MED ORDER — PREDNISONE (PAK) 10 MG PO TABS: ORAL_TABLET | ORAL | Status: DC

## 2013-01-08 NOTE — Patient Instructions (Addendum)
Prednisone 10 mg take  4 each am x 2 days,   2 each am x 2 days,  1 each am x 2 days and stop   Pantoprazole (protonix) 40 mg   Take 30-60 min before first meal of the day and Pepcid 20 mg one bedtime and chlortrimeton 4mg  at bedtime until return to office - this is the best way to tell whether stomach acid is contributing to your problem.    GERD (REFLUX)  is an extremely common cause of respiratory symptoms, many times with no significant heartburn at all.    It can be treated with medication, but also with lifestyle changes including avoidance of late meals, excessive alcohol, smoking cessation, and avoid fatty foods, chocolate, peppermint, colas, red wine, and acidic juices such as orange juice.  NO MINT OR MENTHOL PRODUCTS SO NO COUGH DROPS  USE SUGARLESS CANDY INSTEAD (jolley ranchers or Stover's)  NO OIL BASED VITAMINS - use powdered substitutes.  For cough use delsym or mucinex dm to suppress the urge to cough   Please remember to go to the x-ray department downstairs for your tests - we will call you with the results when they are available.     Please schedule a follow up office visit in 2 weeks, sooner if needed with all medications in hand

## 2013-01-08 NOTE — Progress Notes (Signed)
  Subjective:    Patient ID: [name], male    DOB: [birth year]  MRN: [id number]  HPI  89 yowm quit smoking 2006 with chronic cough at that point which completely resolved although continued then and now to do wood working with onset cough while on ACEi Dec 2012   Referred 01/08/2013 to pulmonary clinic by Dr [name] for cough.    01/08/2013 1st pulmonary eval/[name] stopped acei in April 2014 cc cough daily x 1.5 y w/in10 min of stirring and brings up tan mucus maybe a half a cup total  by lunch, seems worse sitting. Some sinus drainage better with allegra.  Does not remember abx or prednisone being used and w/u by Dr [name] was c/w chronic rinitis and ? gerd but  Not taking meds consistently for either and has not tried either  No obvious daytime or seasonal  variabilty or assoc sob  cp or chest tightness, subjective wheeze overt   hb symptoms. No unusual exp hx or h/o childhood pna/ asthma or knowledge of premature birth.  Sleeping ok without nocturnal  or early am exacerbation  of respiratory  c/o's or need for noct saba. Also denies any obvious fluctuation of symptoms with weather or environmental changes or other aggravating or alleviating factors except as outlined above        Review of Systems  Constitutional: Negative for fever, chills, activity change, appetite change and unexpected weight change.  HENT: Negative for congestion, sore throat, rhinorrhea, sneezing, trouble swallowing, dental problem, voice change and postnasal drip.   Eyes: Negative for visual disturbance.  Respiratory: Positive for cough. Negative for choking and shortness of breath.   Cardiovascular: Negative for chest pain and leg swelling.  Gastrointestinal: Negative for nausea, vomiting and abdominal pain.  Genitourinary: Negative for difficulty urinating.  Musculoskeletal: Negative for arthralgias.  Skin: Negative for rash.  Psychiatric/Behavioral: Negative for behavioral problems and confusion.        Objective:   Physical Exam  Wt Readings from Last 3 Encounters:  01/08/13 197 lb (89.359 kg)    amb pleasant wm nad  HEENT mild turbinate edema.  Oropharynx no thrush or excess pnd or cobblestoning.  No JVD or cervical adenopathy. Mild accessory muscle hypertrophy. Trachea midline, nl thryroid. Chest was hyperinflated by percussion with diminished breath sounds and moderate increased exp time with trace end exp cough/ wheeze. Hoover sign positive at mid inspiration. Regular rate and rhythm without murmur gallop or rub or increase P2 or edema.  Abd: no hsm, nl excursion. Ext warm without cyanosis or clubbing.   CXR  01/08/2013 :  1. No radiographic evidence of acute cardiopulmonary disease. 2. Atherosclerosis      Assessment & Plan:

## 2013-01-09 NOTE — Assessment & Plan Note (Signed)
The most common causes of chronic cough in immunocompetent adults include the following: upper airway cough syndrome (UACS), previously referred to as postnasal drip syndrome (PNDS), which is caused by variety of rhinosinus conditions; (2) asthma; (3) GERD; (4) chronic bronchitis from cigarette smoking or other inhaled environmental irritants; (5) nonasthmatic eosinophilic bronchitis; and (6) bronchiectasis.   These conditions, singly or in combination, have accounted for up to 94% of the causes of chronic cough in prospective studies.   Other conditions have constituted no >6% of the causes in prospective studies These have included bronchogenic carcinoma, chronic interstitial pneumonia, sarcoidosis, left ventricular failure, ACEI-induced cough, and aspiration from a condition associated with pharyngeal dysfunction.    Chronic cough is often simultaneously caused by more than one condition. A single cause has been found from 38 to 82% of the time, multiple causes from 18 to 62%. Multiply caused cough has been the result of three diseases up to 42% of the time.       Most likely this is  Classic Upper airway cough syndrome, so named because it's frequently impossible to sort out how much is  CR/sinusitis with freq throat clearing (which can be related to primary GERD)   vs  causing  secondary (" extra esophageal")  GERD from wide swings in gastric pressure that occur with throat clearing, often  promoting self use of mint and menthol lozenges that reduce the lower esophageal sphincter tone and exacerbate the problem further in a cyclical fashion.   These are the same pts (now being labeled as having "irritable larynx syndrome" by some cough centers) who not infrequently have a history of having failed to tolerate ace inhibitors,  dry powder inhalers or biphosphonates or report having atypical reflux symptoms that don't respond to standard doses of PPI , and are easily confused as having aecopd or asthma  flares by even experienced allergists/ pulmonologists.   Will start with max gerd diet and rx and add hs h1 per guidelines then return to clinic with all meds in hand  Discussed with pt The standardized cough guidelines published in Chest by [place] in 2006 are still the best available and consist of a multiple step process (up to 12!) , not a single office visit,  and are intended  to address this problem logically,  with an alogrithm dependent on response to empiric treatment at  each progressive step  to determine a specific diagnosis with  minimal addtional testing needed. Therefore if adherence is an issue or can't be accurately verified,  it's very unlikely the standard evaluation and treatment will be successful here.    Furthermore, response to therapy (other than acute cough suppression, which should only be used short term with avoidance of narcotic containing cough syrups if possible), can be a gradual process for which the patient may perceive immediate benefit.  Unlike going to an eye doctor where the best perscription is almost always the first one and is immediately effective, this is almost never the case in the management of chronic cough syndromes. Therefore the patient needs to commit up front to consistently adhere to recommendations  for up to 6 weeks of therapy directed at the likely underlying problem(s) before the response can be reasonably evaluated.

## 2013-01-13 NOTE — Progress Notes (Signed)
Quick Note:  Spoke with pt and notified of results per Dr. Wert. Pt verbalized understanding and denied any questions.  ______ 

## 2013-01-22 ENCOUNTER — Ambulatory Visit (INDEPENDENT_AMBULATORY_CARE_PROVIDER_SITE_OTHER): Payer: Medicare Other | Admitting: Internal Medicine

## 2013-01-22 ENCOUNTER — Encounter: Payer: Self-pay | Admitting: Internal Medicine

## 2013-01-22 VITALS — BP 124/60 | HR 70 | Temp 97.8°F | Ht 68.0 in | Wt 199.8 lb

## 2013-01-22 DIAGNOSIS — R05 Cough: Secondary | ICD-10-CM

## 2013-01-22 MED ORDER — MOMETASONE FUROATE 50 MCG/ACT NA SUSP: NASAL | Status: DC

## 2013-01-22 MED ORDER — PREDNISONE (PAK) 10 MG PO TABS: ORAL_TABLET | ORAL | Status: DC

## 2013-01-22 NOTE — Patient Instructions (Addendum)
Start back on nasonex twice daily   If flare ok to take Prednisone 10 mg take  4 each am x 2 days,   2 each am x 2 days,  1 each am x 2 days and stop   Otherwise no change in medications for now  Please schedule a follow up office visit in 6 weeks, call sooner if needed

## 2013-01-22 NOTE — Progress Notes (Signed)
Subjective:    Patient ID: [name], male    DOB: 12/05/1940  MRN: [id number]    Brief patient profile:  72 yowm quit smoking 2006 with chronic cough at that point which completely resolved although continued then and now to do wood working with onset cough while on ACEi Dec 2012   Referred 01/08/2013 to pulmonary clinic by Dr [name] for cough.    01/08/2013 1st pulmonary eval/[name] stopped acei in April 2014 cc cough daily x 1.5 y w/in10 min of stirring and brings up tan mucus maybe a half a cup total  by lunch, seems worse sitting. Some sinus drainage better with allegra.  Does not remember abx or prednisone being used and w/u by Dr [name] was c/w chronic rinitis and ? gerd but  Not taking meds consistently. rec Prednisone 10 mg take  4 each am x 2 days,   2 each am x 2 days,  1 each am x 2 days and stop  Pantoprazole (protonix) 40 mg   Take 30-60 min before first meal of the day and Pepcid 20 mg one bedtime and chlortrimeton 4mg  at bedtime until return to office -  GERD diet. For cough use delsym or mucinex dm to suppress the urge to cough    01/22/2013 f/u ov/[name] re chronic cough Chief Complaint  Patient presents with  . Follow-up    Pt states cough is mcuh improved since his last visit. Still coughing up min light yellow sputum a couple times per day. No new co's.  not limted by sob, most of his mucus prod in ams but not premature awakending. Using netti pot but not nasonex (sometimes use it prn when "nose bad")   No obvious day to day  or seasonal  variabilty or  cp or chest tightness, subjective wheeze overt   hb symptoms. No unusual exp hx or h/o childhood pna/ asthma or knowledge of premature birth.  Sleeping ok without nocturnal  or early am exacerbation  of respiratory  c/o's or need for noct saba. Also denies any obvious fluctuation of symptoms with weather or environmental changes or other aggravating or alleviating factors except as outlined above    Current Medications,  Allergies, Past Medical History, Past Surgical History, Family History, and Social History were reviewed in [name] record.  ROS  The following are not active complaints unless bolded sore throat, dysphagia, dental problems, itching, sneezing,  nasal congestion or excess/ purulent secretions, ear ache,   fever, chills, sweats, unintended wt loss, pleuritic or exertional cp, hemoptysis,  orthopnea pnd or leg swelling, presyncope, palpitations, heartburn, abdominal pain, anorexia, nausea, vomiting, diarrhea  or change in bowel or urinary habits, change in stools or urine, dysuria,hematuria,  rash, arthralgias, visual complaints, headache, numbness weakness or ataxia or problems with walking or coordination,  change in mood/affect or memory.                Objective:   Physical Exam  Wt Readings from Last 3 Encounters:  01/22/13 199 lb 12.8 oz (90.629 kg)  01/08/13 197 lb (89.359 kg)      amb pleasant wm nad with distinct nasal tone to voice  HEENT mild turbinate edema.  Oropharynx no thrush or excess pnd or cobblestoning.  No JVD or cervical adenopathy. Mild accessory muscle hypertrophy. Trachea midline, nl thryroid. Chest was hyperinflated by percussion with diminished breath sounds and moderate increased exp time with trace end exp wheeze only with fvc maneuver. Hoover sign positive  at mid inspiration. Regular rate and rhythm without murmur gallop or rub or increase P2 or edema.  Abd: no hsm, nl excursion. Ext warm without cyanosis or clubbing.   CXR  01/08/2013 :  1. No radiographic evidence of acute cardiopulmonary disease. 2. Atherosclerosis      Assessment & Plan:

## 2013-01-23 NOTE — Assessment & Plan Note (Addendum)
Clearly better with rx directed at  Classic Upper airway cough syndrome, so named because it's frequently impossible to sort out how much is  CR/sinusitis with freq throat clearing (which can be related to primary GERD)   vs  causing  secondary (" extra esophageal")  GERD from wide swings in gastric pressure that occur with throat clearing, often  promoting self use of mint and menthol lozenges that reduce the lower esophageal sphincter tone and exacerbate the problem further in a cyclical fashion.   These are the same pts (now being labeled as having "irritable larynx syndrome" by some cough centers) who not infrequently have a history of having failed to tolerate ace inhibitors,  dry powder inhalers or biphosphonates or report having atypical reflux symptoms that don't respond to standard doses of PPI , and are easily confused as having aecopd or asthma flares by even experienced allergists/ pulmonologists.  Still not clear whether he has element of true asthma but at this point much better so will additional rx to rhinitis   restart nasonex on a maint, not prn basis and continue h1 and h2 at hs

## 2013-01-25 DIAGNOSIS — E782 Mixed hyperlipidemia: Secondary | ICD-10-CM | POA: Diagnosis not present

## 2013-01-25 DIAGNOSIS — I10 Essential (primary) hypertension: Secondary | ICD-10-CM | POA: Diagnosis not present

## 2013-01-25 DIAGNOSIS — G458 Other transient cerebral ischemic attacks and related syndromes: Secondary | ICD-10-CM | POA: Diagnosis not present

## 2013-02-12 DIAGNOSIS — Z85828 Personal history of other malignant neoplasm of skin: Secondary | ICD-10-CM | POA: Diagnosis not present

## 2013-02-12 DIAGNOSIS — L821 Other seborrheic keratosis: Secondary | ICD-10-CM | POA: Diagnosis not present

## 2013-02-12 DIAGNOSIS — D239 Other benign neoplasm of skin, unspecified: Secondary | ICD-10-CM | POA: Diagnosis not present

## 2013-02-12 DIAGNOSIS — L719 Rosacea, unspecified: Secondary | ICD-10-CM | POA: Diagnosis not present

## 2013-02-12 DIAGNOSIS — L819 Disorder of pigmentation, unspecified: Secondary | ICD-10-CM | POA: Diagnosis not present

## 2013-02-12 DIAGNOSIS — D1801 Hemangioma of skin and subcutaneous tissue: Secondary | ICD-10-CM | POA: Diagnosis not present

## 2013-02-12 DIAGNOSIS — L570 Actinic keratosis: Secondary | ICD-10-CM | POA: Diagnosis not present

## 2013-03-03 DIAGNOSIS — C61 Malignant neoplasm of prostate: Secondary | ICD-10-CM | POA: Diagnosis not present

## 2013-03-08 ENCOUNTER — Ambulatory Visit (INDEPENDENT_AMBULATORY_CARE_PROVIDER_SITE_OTHER): Payer: Medicare Other | Admitting: Internal Medicine

## 2013-03-08 ENCOUNTER — Ambulatory Visit (INDEPENDENT_AMBULATORY_CARE_PROVIDER_SITE_OTHER)
Admission: RE | Admit: 2013-03-08 | Discharge: 2013-03-08 | Disposition: A | Discharge status: Home / Self Care | Payer: Medicare Other | Source: Ambulatory Visit | Attending: Internal Medicine | Admitting: Internal Medicine

## 2013-03-08 ENCOUNTER — Other Ambulatory Visit (INDEPENDENT_AMBULATORY_CARE_PROVIDER_SITE_OTHER): Payer: Medicare Other

## 2013-03-08 ENCOUNTER — Encounter: Payer: Self-pay | Admitting: Internal Medicine

## 2013-03-08 VITALS — BP 132/72 | HR 78 | Temp 98.1°F | Ht 68.0 in | Wt 200.0 lb

## 2013-03-08 DIAGNOSIS — R05 Cough: Secondary | ICD-10-CM | POA: Diagnosis not present

## 2013-03-08 DIAGNOSIS — I10 Essential (primary) hypertension: Secondary | ICD-10-CM | POA: Diagnosis not present

## 2013-03-08 MED ORDER — AZELASTINE-FLUTICASONE 137-50 MCG/ACT NA SUSP: 1.0000 | Freq: Two times a day (BID) | NASAL | Status: DC

## 2013-03-08 MED ORDER — TELMISARTAN 40 MG PO TABS: 40.0000 mg | ORAL_TABLET | Freq: Every day | ORAL | Status: DC

## 2013-03-08 NOTE — Patient Instructions (Addendum)
Please see patient coordinator before you leave today  to schedule repeat sinus CT   Stop cozar (losaartan) and start micardis 40 mg samples take one daily   Stop nasonex and start dymista one twice daily each nostril   For cough > mucinex dm 1200 mg every 12 hours as needed   Please schedule a follow up office visit in 4 weeks, sooner if needed with all active medications in hand including over the counters

## 2013-03-08 NOTE — Progress Notes (Signed)
Subjective:    Patient ID: [name], male    DOB: 11-06-1940  MRN: [id number]    Brief patient profile:  72 yowm quit smoking 2006 with chronic cough at that point which completely resolved although continued then and now to do wood working with onset cough while on ACEi Dec 2012   Referred 01/08/2013 to pulmonary clinic by Dr Jearld Fenton for cough.   History of Present Illness  01/08/2013 1st pulmonary eval/[name] stopped acei in April 2014 cc cough daily x 1.5 y w/in10 min of stirring and brings up tan mucus maybe a half a cup total  by lunch, seems worse sitting. Some sinus drainage better with allegra.  Does not remember abx or prednisone being used and w/u by Dr Jearld Fenton was c/w chronic rinitis and ? gerd but  Not taking meds consistently. rec Prednisone 10 mg take  4 each am x 2 days,   2 each am x 2 days,  1 each am x 2 days and stop  Pantoprazole (protonix) 40 mg   Take 30-60 min before first meal of the day and Pepcid 20 mg one bedtime and chlortrimeton 4mg  at bedtime until return to office -  GERD diet. For cough use delsym or mucinex dm to suppress the urge to cough    01/22/2013 f/u ov/[name] re chronic cough Chief Complaint  Patient presents with  . Follow-up    Pt states cough is mcuh improved since his last visit. Still coughing up min light yellow sputum a couple times per day. No new co's.  not limted by sob, most of his mucus prod in ams but not premature awakending. Using netti pot but not nasonex (sometimes use it prn when "nose bad") rec Start back on nasonex twice daily  If flare ok to take Prednisone 10 mg take  4 each am x 2 days,   2 each am x 2 days,  1 each am x 2 days and stop    03/08/2013 f/u ov/[name] re chronic cough Chief Complaint  Patient presents with  . 6 Week Follow Up    Cough is still present, unchanged. Reports production of yellow/green mucus. Cough is consistent throughout the day.   did not stay on meds as rec, confused with names of meds he is on. No  noct disturbance at all. Says "zero" response to everything we've done - not even transiently better with prednisone    No obvious day to day  or seasonal  variabilty or  cp or chest tightness, subjective wheeze overt   hb symptoms. No unusual exp hx or h/o childhood pna/ asthma or knowledge of premature birth.  Sleeping ok without nocturnal  or early am exacerbation  of respiratory  c/o's or need for noct saba. Also denies any obvious fluctuation of symptoms with weather or environmental changes or other aggravating or alleviating factors except as outlined above    Current Medications, Allergies, Past Medical History, Past Surgical History, Family History, and Social History were reviewed in [employer] record.  ROS  The following are not active complaints unless bolded sore throat, dysphagia, dental problems, itching, sneezing,  nasal congestion or excess/ purulent secretions, ear ache,   fever, chills, sweats, unintended wt loss, pleuritic or exertional cp, hemoptysis,  orthopnea pnd or leg swelling, presyncope, palpitations, heartburn, abdominal pain, anorexia, nausea, vomiting, diarrhea  or change in bowel or urinary habits, change in stools or urine, dysuria,hematuria,  rash, arthralgias, visual complaints, headache, numbness weakness or ataxia  or problems with walking or coordination,  change in mood/affect or memory.                Objective:   Physical Exam  03/08/2013       200  Wt Readings from Last 3 Encounters:  01/22/13 199 lb 12.8 oz (90.629 kg)  01/08/13 197 lb (89.359 kg)      amb pleasant wm nad with distinct nasal tone to voice  HEENT: nl dentition, turbinates, and orophanx. Nl external ear canals without cough reflex   NECK :  without JVD/Nodes/TM/ nl carotid upstrokes bilaterally   LUNGS: no acc muscle use, clear to A and P bilaterally without cough on insp or exp maneuvers   CV:  RRR  no s3 or murmur or increase in P2, no edema    ABD:  soft and nontender with nl excursion in the supine position. No bruits or organomegaly, bowel sounds nl  MS:  warm without deformities, calf tenderness, cyanosis or clubbing  SKIN: warm and dry without lesions    NEURO:  alert, approp, no deficits    CXR  01/08/2013 :  1. No radiographic evidence of acute cardiopulmonary disease. 2. Atherosclerosis      Assessment & Plan:

## 2013-03-09 ENCOUNTER — Encounter: Payer: Self-pay | Admitting: Internal Medicine

## 2013-03-09 DIAGNOSIS — I10 Essential (primary) hypertension: Secondary | ICD-10-CM | POA: Insufficient documentation

## 2013-03-09 LAB — ALLERGY PROFILE REGION II-DC, DE, MD, ~~LOC~~, VA
Allergen, D pternoyssinus,d7: <0.1 kU/L
Bermuda Grass: <0.1 kU/L
Box Elder IgE: <0.1 kU/L
Cat Dander: <0.1 kU/L
Cockroach: <0.1 kU/L
Common Ragweed: <0.1 kU/L
Dog Dander: <0.1 kU/L
Elm IgE: <0.1 kU/L
Johnson Grass: <0.1 kU/L
Pecan/Hickory Tree IgE: <0.1 kU/L

## 2013-03-09 LAB — CBC WITH DIFFERENTIAL/PLATELET
Basophils Absolute: 0.1 10*3/uL (ref 0.0–0.1)
Eosinophils Absolute: 0.1 10*3/uL (ref 0.0–0.7)
Eosinophils Relative: 0.5 % (ref 0.0–5.0)
HCT: 41.5 % (ref 39.0–52.0)
Lymphs Abs: 1.9 10*3/uL (ref 0.7–4.0)
MCV: 90.2 fl (ref 78.0–100.0)
Monocytes Absolute: 1.3 10*3/uL — ABNORMAL HIGH (ref 0.1–1.0)
Neutrophils Relative %: 76.2 % (ref 43.0–77.0)
Platelets: 269 10*3/uL (ref 150.0–400.0)
RDW: 12.9 % (ref 11.5–14.6)
WBC: 14 10*3/uL — ABNORMAL HIGH (ref 4.5–10.5)

## 2013-03-09 NOTE — Assessment & Plan Note (Addendum)
-   Sinus CT 03/08/2013 > chronic changes without a/f levels - Allergy Profile 03/08/2013 >>>  Clearly he has  Classic Upper airway cough syndrome, so named because it's frequently impossible to sort out how much is  CR/sinusitis with freq throat clearing (which can be related to primary GERD)   vs  causing  secondary (" extra esophageal")  GERD from wide swings in gastric pressure that occur with throat clearing, often  promoting self use of mint and menthol lozenges that reduce the lower esophageal sphincter tone and exacerbate the problem further in a cyclical fashion.   These are the same pts (now being labeled as having "irritable larynx syndrome" by some cough centers) who not infrequently have a history of having failed to tolerate ace inhibitors,  dry powder inhalers or biphosphonates or report having atypical reflux symptoms that don't respond to standard doses of PPI , and are easily confused as having aecopd or asthma flares by even experienced allergists/ pulmonologists.   I strongly he has been consistent in his approach to taking meds consistently but perhaps the simpler we keep things the better at this point using the principle that sometimes less (rx) is more (better outcome)  rec trial of dymista and mucinex dm prn then regroup in 4 weeks with all meds in hand

## 2013-03-09 NOTE — Assessment & Plan Note (Signed)
D/c ACEi permanently Arpil 2014 but he's on cozaar which is the one generic arb we've seen contribute to cough so will try substituting micardis 40 mg daily x 4 week trial

## 2013-03-10 DIAGNOSIS — N529 Male erectile dysfunction, unspecified: Secondary | ICD-10-CM | POA: Diagnosis not present

## 2013-03-10 DIAGNOSIS — C61 Malignant neoplasm of prostate: Secondary | ICD-10-CM | POA: Diagnosis not present

## 2013-03-10 NOTE — Progress Notes (Signed)
Quick Note:  Spoke with pt and notified of results per Dr. Wert. Pt verbalized understanding and denied any questions.  ______ 

## 2013-03-24 DIAGNOSIS — M67919 Unspecified disorder of synovium and tendon, unspecified shoulder: Secondary | ICD-10-CM | POA: Diagnosis not present

## 2013-03-25 DIAGNOSIS — M47817 Spondylosis without myelopathy or radiculopathy, lumbosacral region: Secondary | ICD-10-CM | POA: Diagnosis not present

## 2013-03-25 DIAGNOSIS — M79609 Pain in unspecified limb: Secondary | ICD-10-CM | POA: Diagnosis not present

## 2013-03-25 DIAGNOSIS — M542 Cervicalgia: Secondary | ICD-10-CM | POA: Diagnosis not present

## 2013-03-25 DIAGNOSIS — IMO0002 Reserved for concepts with insufficient information to code with codable children: Secondary | ICD-10-CM | POA: Diagnosis not present

## 2013-03-29 DIAGNOSIS — Z23 Encounter for immunization: Secondary | ICD-10-CM | POA: Diagnosis not present

## 2013-04-05 ENCOUNTER — Encounter: Payer: Self-pay | Admitting: Internal Medicine

## 2013-04-05 ENCOUNTER — Ambulatory Visit (INDEPENDENT_AMBULATORY_CARE_PROVIDER_SITE_OTHER): Payer: Medicare Other | Admitting: Internal Medicine

## 2013-04-05 VITALS — BP 126/70 | HR 72 | Temp 98.1°F | Ht 68.0 in | Wt 194.0 lb

## 2013-04-05 DIAGNOSIS — R05 Cough: Secondary | ICD-10-CM | POA: Diagnosis not present

## 2013-04-05 DIAGNOSIS — I10 Essential (primary) hypertension: Secondary | ICD-10-CM

## 2013-04-05 MED ORDER — PREDNISONE (PAK) 10 MG PO TABS: ORAL_TABLET | ORAL | Status: DC

## 2013-04-05 MED ORDER — AMOXICILLIN-POT CLAVULANATE 875-125 MG PO TABS: 1.0000 | ORAL_TABLET | Freq: Two times a day (BID) | ORAL | Status: DC

## 2013-04-05 MED ORDER — MONTELUKAST SODIUM 10 MG PO TABS: ORAL_TABLET | ORAL | Status: DC

## 2013-04-05 MED ORDER — AZELASTINE-FLUTICASONE 137-50 MCG/ACT NA SUSP: 1.0000 | Freq: Two times a day (BID) | NASAL | Status: DC

## 2013-04-05 NOTE — Progress Notes (Signed)
Subjective:    Patient ID: [name], male    DOB: August 15, 1940  MRN: [id number]    Brief patient profile:  72 yowm quit smoking 2006 with chronic cough at that point which completely resolved although continued then and now to do wood working with onset cough while on ACEi Dec 2012   Referred 01/08/2013 to pulmonary clinic by Dr Jearld Fenton for cough.   History of Present Illness  01/08/2013 1st pulmonary eval/[name] stopped acei in April 2014 cc cough daily x 1.5 y w/in10 min of stirring and brings up tan mucus maybe a half a cup total  by lunch, seems worse sitting. Some sinus drainage better with allegra.  Does not remember abx or prednisone being used and w/u by Dr Jearld Fenton was c/w chronic rinitis and ? gerd but  Not taking meds consistently. rec Prednisone 10 mg take  4 each am x 2 days,   2 each am x 2 days,  1 each am x 2 days and stop  Pantoprazole (protonix) 40 mg   Take 30-60 min before first meal of the day and Pepcid 20 mg one bedtime and chlortrimeton 4mg  at bedtime until return to office -  GERD diet. For cough use delsym or mucinex dm to suppress the urge to cough    01/22/2013 f/u ov/[name] re chronic cough Chief Complaint  Patient presents with  . Follow-up    Pt states cough is mcuh improved since his last visit. Still coughing up min light yellow sputum a couple times per day. No new co's.  not limted by sob, most of his mucus prod in ams but not premature awakending. Using netti pot but not nasonex (sometimes use it prn when "nose bad") rec Start back on nasonex twice daily  If flare ok to take Prednisone 10 mg take  4 each am x 2 days,   2 each am x 2 days,  1 each am x 2 days and stop    03/08/2013 f/u ov/[name] re chronic cough Chief Complaint  Patient presents with  . 6 Week Follow Up    Cough is still present, unchanged. Reports production of yellow/green mucus. Cough is consistent throughout the day.   did not stay on meds as rec, confused with names of meds he is on. No  noct disturbance at all. Says "zero" response to everything we've done - not even transiently better with prednisone  rec Please see patient coordinator before you leave today  to schedule repeat sinus CT  Stop cozar (losaartan) and start micardis 40 mg samples take one daily  Stop nasonex and start dymista one twice daily each nostril  For cough > mucinex dm 1200 mg every 12 hours as needed    04/05/2013 f/u ov/[name] re: cough x 2 years Chief Complaint  Patient presents with  . Follow-up    Pt states cough had improved until he developed sinus infection approx 5 days after his last visit. Cough is prod with moderate amount tan/yellow sputum.  He also c/o minoe sore throat.   cough is worse early in am  And also in pm if uses his voice. Still using vit D oil based vit dymista helping nasal drainage better than anything else. Did not maintain on gerd rx   No obvious pattern in day to day  or daytime variabilty or  cp or chest tightness, subjective wheeze overt   hb symptoms. No unusual exp hx or h/o childhood pna/ asthma or knowledge of premature birth.  Sleeping ok without nocturnal  or early am exacerbation  of respiratory  c/o's or need for noct saba. Also denies any obvious fluctuation of symptoms with weather or environmental changes or other aggravating or alleviating factors except as outlined above    Current Medications, Allergies, Past Medical History, Past Surgical History, Family History, and Social History were reviewed in [employer] record.  ROS  The following are not active complaints unless bolded sore throat, dysphagia, dental problems, itching, sneezing,  nasal congestion or excess/ purulent secretions, ear ache,   fever, chills, sweats, unintended wt loss, pleuritic or exertional cp, hemoptysis,  orthopnea pnd or leg swelling, presyncope, palpitations, heartburn, abdominal pain, anorexia, nausea, vomiting, diarrhea  or change in bowel or urinary  habits, change in stools or urine, dysuria,hematuria,  rash, arthralgias, visual complaints, headache, numbness weakness or ataxia or problems with walking or coordination,  change in mood/affect or memory.                Objective:   Physical Exam  03/08/2013       200 > 04/05/2013   194  Wt Readings from Last 3 Encounters:  01/22/13 199 lb 12.8 oz (90.629 kg)  01/08/13 197 lb (89.359 kg)      amb pleasant wm nad with distinct nasal tone to voice and barking upper airway pattern dry cough  HEENT: nl dentition, turbinates, and orophanx. Nl external ear canals without cough reflex   NECK :  without JVD/Nodes/TM/ nl carotid upstrokes bilaterally   LUNGS: no acc muscle use, clear to A and P bilaterally without cough on insp or exp maneuvers   CV:  RRR  no s3 or murmur or increase in P2, no edema   ABD:  soft and nontender with nl excursion in the supine position. No bruits or organomegaly, bowel sounds nl  MS:  warm without deformities, calf tenderness, cyanosis or clubbing  SKIN: warm and dry without lesions         CXR  01/08/2013 : 1. No radiographic evidence of acute cardiopulmonary disease. 2. Atherosclerosis      Assessment & Plan:

## 2013-04-05 NOTE — Assessment & Plan Note (Addendum)
Sinus CT 03/08/2013 > chronic changes without a/f levels - Allergy Profile 03/08/2013 > no Eos, IgE 5  Note now says "nothing we've done has helped" where previously reported "much improvement" on gerd rx which he subsequently stopped.   Now appears to have acute rhinitis with pnds and entirely upper airway cough > rx with 10 d of augmentin and pred x 6 days  rec max noct gerd and h1 rx per guidelines plus trial of singulair and ent f/u if still coughing now off acei long enough to reproup.    Each maintenance medication was reviewed in detail including most importantly the difference between maintenance and as needed and under what circumstances the prns are to be used.  Please see instructions for details which were reviewed in writing and the patient given a copy.

## 2013-04-05 NOTE — Assessment & Plan Note (Signed)
D/c ACEi permanently Arpil 2014 Change cozar to micardis 03/09/2013 > stopped for one week prior to OV    Adequate control on present rx, reviewed > no change in rx needed  (norvasc 10 mg daily working fine on it's own

## 2013-04-05 NOTE — Patient Instructions (Addendum)
augmentin 875 twice daily x 10 days Prednisone 10 mg take  4 each am x 2 days, 2 each am x 2 days,  1 each am x 2 days and stop  Take pepcid 20mg  and chlortrimeton 4 mg at bedtime as long as coughing  Continue dymista and add singulair 10 mg daily for at least a month and  stop if not helping your cough or nose   GERD (REFLUX)  is an extremely common cause of respiratory symptoms, many times with no significant heartburn at all.    It can be treated with medication, but also with lifestyle changes including avoidance of late meals, excessive alcohol, smoking cessation, and avoid fatty foods, chocolate, peppermint, colas, red wine, and acidic juices such as orange juice.  NO MINT OR MENTHOL PRODUCTS SO NO COUGH DROPS  USE SUGARLESS CANDY INSTEAD (jolley ranchers or Stover's)  NO OIL BASED VITAMINS - use powdered substitutes.   If not satisfied then next step is to return to Dr Jearld Fenton

## 2013-05-11 DIAGNOSIS — H2510 Age-related nuclear cataract, unspecified eye: Secondary | ICD-10-CM | POA: Diagnosis not present

## 2013-05-11 DIAGNOSIS — H04129 Dry eye syndrome of unspecified lacrimal gland: Secondary | ICD-10-CM | POA: Diagnosis not present

## 2013-05-11 DIAGNOSIS — E119 Type 2 diabetes mellitus without complications: Secondary | ICD-10-CM | POA: Diagnosis not present

## 2013-05-11 DIAGNOSIS — H43819 Vitreous degeneration, unspecified eye: Secondary | ICD-10-CM | POA: Diagnosis not present

## 2013-06-22 DIAGNOSIS — IMO0002 Reserved for concepts with insufficient information to code with codable children: Secondary | ICD-10-CM | POA: Diagnosis not present

## 2013-06-22 DIAGNOSIS — M47817 Spondylosis without myelopathy or radiculopathy, lumbosacral region: Secondary | ICD-10-CM | POA: Diagnosis not present

## 2013-06-22 DIAGNOSIS — M79609 Pain in unspecified limb: Secondary | ICD-10-CM | POA: Diagnosis not present

## 2013-08-20 ENCOUNTER — Encounter: Payer: Self-pay | Admitting: Interventional Cardiology

## 2013-09-15 DIAGNOSIS — C61 Malignant neoplasm of prostate: Secondary | ICD-10-CM | POA: Diagnosis not present

## 2013-09-22 DIAGNOSIS — N529 Male erectile dysfunction, unspecified: Secondary | ICD-10-CM | POA: Diagnosis not present

## 2013-09-22 DIAGNOSIS — C61 Malignant neoplasm of prostate: Secondary | ICD-10-CM | POA: Diagnosis not present

## 2013-09-27 ENCOUNTER — Ambulatory Visit: Payer: Medicare Other | Admitting: Interventional Cardiology

## 2013-09-30 DIAGNOSIS — M79609 Pain in unspecified limb: Secondary | ICD-10-CM | POA: Diagnosis not present

## 2013-09-30 DIAGNOSIS — E1149 Type 2 diabetes mellitus with other diabetic neurological complication: Secondary | ICD-10-CM | POA: Diagnosis not present

## 2013-09-30 DIAGNOSIS — IMO0002 Reserved for concepts with insufficient information to code with codable children: Secondary | ICD-10-CM | POA: Diagnosis not present

## 2013-09-30 DIAGNOSIS — M47817 Spondylosis without myelopathy or radiculopathy, lumbosacral region: Secondary | ICD-10-CM | POA: Diagnosis not present

## 2013-10-06 DIAGNOSIS — I10 Essential (primary) hypertension: Secondary | ICD-10-CM | POA: Diagnosis not present

## 2013-10-06 DIAGNOSIS — R7301 Impaired fasting glucose: Secondary | ICD-10-CM | POA: Diagnosis not present

## 2013-10-06 DIAGNOSIS — C61 Malignant neoplasm of prostate: Secondary | ICD-10-CM | POA: Diagnosis not present

## 2013-10-06 DIAGNOSIS — J449 Chronic obstructive pulmonary disease, unspecified: Secondary | ICD-10-CM | POA: Diagnosis not present

## 2013-10-06 DIAGNOSIS — Z1331 Encounter for screening for depression: Secondary | ICD-10-CM | POA: Diagnosis not present

## 2013-10-06 DIAGNOSIS — Z23 Encounter for immunization: Secondary | ICD-10-CM | POA: Diagnosis not present

## 2013-10-06 DIAGNOSIS — Z0000 Encounter for general adult medical examination without abnormal findings: Secondary | ICD-10-CM | POA: Diagnosis not present

## 2013-10-06 DIAGNOSIS — L570 Actinic keratosis: Secondary | ICD-10-CM | POA: Diagnosis not present

## 2013-10-06 DIAGNOSIS — E782 Mixed hyperlipidemia: Secondary | ICD-10-CM | POA: Diagnosis not present

## 2013-10-14 ENCOUNTER — Other Ambulatory Visit: Payer: Self-pay | Admitting: Gastroenterology

## 2013-10-14 DIAGNOSIS — D129 Benign neoplasm of anus and anal canal: Secondary | ICD-10-CM | POA: Diagnosis not present

## 2013-10-14 DIAGNOSIS — K621 Rectal polyp: Secondary | ICD-10-CM | POA: Diagnosis not present

## 2013-10-14 DIAGNOSIS — Z86010 Personal history of colonic polyps: Secondary | ICD-10-CM | POA: Diagnosis not present

## 2013-10-14 DIAGNOSIS — D128 Benign neoplasm of rectum: Secondary | ICD-10-CM | POA: Diagnosis not present

## 2013-10-14 DIAGNOSIS — Z09 Encounter for follow-up examination after completed treatment for conditions other than malignant neoplasm: Secondary | ICD-10-CM | POA: Diagnosis not present

## 2013-10-14 DIAGNOSIS — K620 Anal polyp: Secondary | ICD-10-CM | POA: Diagnosis not present

## 2013-10-20 ENCOUNTER — Encounter: Payer: Self-pay | Admitting: Interventional Cardiology

## 2013-10-20 ENCOUNTER — Encounter: Payer: Self-pay | Admitting: Cardiology

## 2013-10-20 ENCOUNTER — Ambulatory Visit (INDEPENDENT_AMBULATORY_CARE_PROVIDER_SITE_OTHER): Payer: Medicare Other | Admitting: Interventional Cardiology

## 2013-10-20 VITALS — BP 110/68 | HR 54 | Ht 68.0 in | Wt 199.1 lb

## 2013-10-20 DIAGNOSIS — R198 Other specified symptoms and signs involving the digestive system and abdomen: Secondary | ICD-10-CM | POA: Diagnosis not present

## 2013-10-20 DIAGNOSIS — E782 Mixed hyperlipidemia: Secondary | ICD-10-CM | POA: Diagnosis not present

## 2013-10-20 DIAGNOSIS — I10 Essential (primary) hypertension: Secondary | ICD-10-CM

## 2013-10-20 DIAGNOSIS — I771 Stricture of artery: Secondary | ICD-10-CM

## 2013-10-20 DIAGNOSIS — Z139 Encounter for screening, unspecified: Secondary | ICD-10-CM

## 2013-10-20 NOTE — Progress Notes (Signed)
Patient ID: [name], male   DOB: 19-Dec-1940, 73 y.o.   MRN: [id number] Exam should read: Bilateral carotid bruits, right subclavian bruit, 2+ right radial pulse, 3+ left radial pulse  Jettie Booze

## 2013-10-20 NOTE — Progress Notes (Signed)
Patient ID: [name], male   DOB: 1941-03-13, 73 y.o.   MRN: [id number]    [name], [name], [name]  [id number] Phone: [phone] Fax:  [phone]  Date:  10/20/2013   ID:  [name], DOB 1940-09-03, MRN [id number]  PCP:  Gennette Pac, MD      History of Present Illness: [name] is a 73 y.o. male who has PVD. He has subclavian stenosis but no arm claudication. His BP was elevated. Amlodipine was added, and later increased to 10 mg by Dr. Rex Kras. BP at home with rest has been in the 371-062 range systolic. WHen he is active, BP is a little higher. He has problems with foot drop, but he is able to maintain his walking. Hypertension:  Hand numbness has resolved with wrist brace for carpal tunnel syndrome. He wears it at night. He walks 3 miles a day, 4 x /week. No CP , SHOB.    Wt Readings from Last 3 Encounters:  10/20/13 199 lb 1.9 oz (90.32 kg)  04/05/13 194 lb (87.998 kg)  03/08/13 200 lb (90.719 kg)     Past Medical History  Diagnosis Date  . Allergic rhinitis   . Hearing loss   . Chronic sinusitis   . Prostate cancer 2010  . Hypertension   . Hyperlipidemia   . Colon polyps   . Actinic keratosis     AND ECZEMA followed by Dr. Tonia Brooms  . Impaired glucose tolerance   . COPD (chronic obstructive pulmonary disease)   . Carotid artery occlusion     abn, subclavian steal  . Arthritis     in hands and recent trigger finger injections Dr. Daylene Katayama  . Neuropathy     because of C-spice and L-spine disease-seen by Dr. Primus Bravo    Current Outpatient Prescriptions  Medication Sig Dispense Refill  . amLODipine (NORVASC) 5 MG tablet Take 10 mg by mouth daily.       Marland Kitchen aspirin 81 MG tablet Take 81 mg by mouth daily.      . Coenzyme Q10 (CO Q 10 PO) Take 1 capsule by mouth daily.      . DULoxetine (CYMBALTA) 30 MG capsule Take 60 mg by mouth daily. 30 MG in the morning and 30 MG at night      . gabapentin (NEURONTIN) 300 MG capsule Take 1,200  mg by mouth daily.       . pravastatin (PRAVACHOL) 40 MG tablet Take 20 mg by mouth daily.       . vitamin B-12 (CYANOCOBALAMIN) 1000 MCG tablet Take 1,000 mcg by mouth daily.       No current facility-administered medications for this visit.    Allergies:    Allergies  Allergen Reactions  . Wellbutrin [Bupropion]   . Zocor [Simvastatin]     Social History:  The patient  reports that he quit smoking about 9 years ago. His smoking use included Cigarettes. He has a 67.5 pack-year smoking history. He has never used smokeless tobacco. He reports that he drinks alcohol. He reports that he does not use illicit drugs.   Family History:  The patient's family history includes Heart disease in his father; Heart failure in his father; Leukemia in his brother; Lymphoma in his father; Uterine cancer in his mother.   ROS:  Please see the history of present illness.  No nausea, vomiting.  No fevers, chills.  No focal weakness.  No dysuria.   All other  systems reviewed and negative.   PHYSICAL EXAM: VS:  BP 110/68  Pulse 54  Ht 5\' 8"  (1.727 m)  Wt 199 lb 1.9 oz (90.32 kg)  BMI 30.28 kg/m2 Well nourished, well developed, in no acute distress HEENT: normal Neck: no JVD, no carotid bruits Cardiac:  normal S1, S2; RRR;  Lungs:  clear to auscultation bilaterally, no wheezing, rhonchi or rales Abd: soft, nontender,; prominent aortic pulse Ext: no edema Skin: warm and dry Neuro:   no focal abnormalities noted    ASSESSMENT AND PLAN:  Hypertension, essential  Continue Amlodipine Besylate Tablet, 10 MG, 1 tablet, Orally, Once a day, 30 day(s), 30, Refills 11 Continue Losartan Potassium Tablet, 50 MG, tab daily.0  Controlled today. On my recheck, he had a left arm pressure 138/72.    2. Subclavian steal syndrome  Notes: No signs of right arm claudication. Would continue conservative management. Occasional numbness in fingers of right hand in certain positions. No problems when moving right arm  vigorously or doing any heavy lifting. Resolved with carpal tunnel brace.   Check right upper extremity arteriogram.    3. Mixed hyperlipidemia  Continue Pravastatin Sodium Tablet, 20 MG, 1 tablet, Orally, Once a day Notes: LDL 113. LDL target < 100 due to PAD. LDL 91 while off of pravastatin in 4/14.  LDL 82 in 4/15.   4.  Given his h/o smoking and prominent aortic pulse, we will check an abdominal aortic duplex. Preventive Medicine  Adult topics discussed:  Diet: healthy diet.  Exercise: 5 days a week, at least 30 minutes of aerobic exercise.      Signed, Mina Marble, MD, [facility] [place] 10/20/2013 4:09 PM

## 2013-10-20 NOTE — Patient Instructions (Signed)
Your physician has requested that you have an abdominal aorta duplex. During this test, an ultrasound is used to evaluate the aorta. Allow 30 minutes for this exam. Do not eat after midnight the day before and avoid carbonated beverages  Your physician has requested that you have a lower or right upper extremity arterial duplex. This test is an ultrasound of the arteries in the legs or arms. It looks at arterial blood flow in the legs and arms. Allow one hour for Lower and Upper Arterial scans. There are no restrictions or special instructions  Your physician wants you to follow-up in: 1 year with Dr. Irish Lack. You will receive a reminder letter in the mail two months in advance. If you don't receive a letter, please call our office to schedule the follow-up appointment.

## 2013-10-21 ENCOUNTER — Other Ambulatory Visit (HOSPITAL_COMMUNITY): Payer: Self-pay | Admitting: Cardiology

## 2013-10-21 DIAGNOSIS — R0989 Other specified symptoms and signs involving the circulatory and respiratory systems: Secondary | ICD-10-CM

## 2013-10-21 DIAGNOSIS — I771 Stricture of artery: Secondary | ICD-10-CM

## 2013-10-25 ENCOUNTER — Other Ambulatory Visit (HOSPITAL_COMMUNITY): Payer: Self-pay | Admitting: Cardiology

## 2013-10-27 ENCOUNTER — Ambulatory Visit (HOSPITAL_COMMUNITY): Payer: Medicare Other | Attending: Cardiology | Admitting: Cardiology

## 2013-10-27 DIAGNOSIS — G458 Other transient cerebral ischemic attacks and related syndromes: Secondary | ICD-10-CM | POA: Diagnosis not present

## 2013-10-27 DIAGNOSIS — I10 Essential (primary) hypertension: Secondary | ICD-10-CM | POA: Insufficient documentation

## 2013-10-27 DIAGNOSIS — Z87891 Personal history of nicotine dependence: Secondary | ICD-10-CM | POA: Insufficient documentation

## 2013-10-27 DIAGNOSIS — I658 Occlusion and stenosis of other precerebral arteries: Secondary | ICD-10-CM | POA: Diagnosis not present

## 2013-10-27 DIAGNOSIS — I6529 Occlusion and stenosis of unspecified carotid artery: Secondary | ICD-10-CM | POA: Diagnosis not present

## 2013-10-27 DIAGNOSIS — E785 Hyperlipidemia, unspecified: Secondary | ICD-10-CM | POA: Diagnosis not present

## 2013-10-27 DIAGNOSIS — I771 Stricture of artery: Secondary | ICD-10-CM

## 2013-10-27 DIAGNOSIS — J4489 Other specified chronic obstructive pulmonary disease: Secondary | ICD-10-CM | POA: Insufficient documentation

## 2013-10-27 DIAGNOSIS — J449 Chronic obstructive pulmonary disease, unspecified: Secondary | ICD-10-CM | POA: Insufficient documentation

## 2013-10-27 DIAGNOSIS — R0989 Other specified symptoms and signs involving the circulatory and respiratory systems: Secondary | ICD-10-CM

## 2013-10-27 NOTE — Progress Notes (Signed)
Carotid duplex complete 

## 2013-10-28 ENCOUNTER — Ambulatory Visit (HOSPITAL_COMMUNITY): Payer: Medicare Other | Attending: Cardiovascular Disease | Admitting: Cardiology

## 2013-10-28 DIAGNOSIS — Z139 Encounter for screening, unspecified: Secondary | ICD-10-CM

## 2013-10-28 DIAGNOSIS — Z87891 Personal history of nicotine dependence: Secondary | ICD-10-CM

## 2013-10-28 DIAGNOSIS — R198 Other specified symptoms and signs involving the digestive system and abdomen: Secondary | ICD-10-CM

## 2013-10-28 DIAGNOSIS — I700 Atherosclerosis of aorta: Secondary | ICD-10-CM

## 2013-10-28 NOTE — Progress Notes (Signed)
Abdominal aorta duplex completed.  

## 2013-11-01 ENCOUNTER — Telehealth: Payer: Self-pay | Admitting: Interventional Cardiology

## 2013-11-01 NOTE — Telephone Encounter (Signed)
Returned pt's call.

## 2013-11-01 NOTE — Telephone Encounter (Signed)
New message      Calling to get test results.  OK to leave with wife

## 2013-11-29 DIAGNOSIS — H6980 Other specified disorders of Eustachian tube, unspecified ear: Secondary | ICD-10-CM | POA: Diagnosis not present

## 2013-11-29 DIAGNOSIS — J329 Chronic sinusitis, unspecified: Secondary | ICD-10-CM | POA: Diagnosis not present

## 2013-11-29 DIAGNOSIS — J3089 Other allergic rhinitis: Secondary | ICD-10-CM | POA: Diagnosis not present

## 2013-12-20 DIAGNOSIS — H6980 Other specified disorders of Eustachian tube, unspecified ear: Secondary | ICD-10-CM | POA: Diagnosis not present

## 2013-12-20 DIAGNOSIS — J329 Chronic sinusitis, unspecified: Secondary | ICD-10-CM | POA: Diagnosis not present

## 2014-01-27 DIAGNOSIS — M47817 Spondylosis without myelopathy or radiculopathy, lumbosacral region: Secondary | ICD-10-CM | POA: Diagnosis not present

## 2014-01-27 DIAGNOSIS — M546 Pain in thoracic spine: Secondary | ICD-10-CM | POA: Diagnosis not present

## 2014-01-27 DIAGNOSIS — M542 Cervicalgia: Secondary | ICD-10-CM | POA: Diagnosis not present

## 2014-01-27 DIAGNOSIS — IMO0002 Reserved for concepts with insufficient information to code with codable children: Secondary | ICD-10-CM | POA: Diagnosis not present

## 2014-02-25 DIAGNOSIS — L570 Actinic keratosis: Secondary | ICD-10-CM | POA: Diagnosis not present

## 2014-02-25 DIAGNOSIS — L821 Other seborrheic keratosis: Secondary | ICD-10-CM | POA: Diagnosis not present

## 2014-02-25 DIAGNOSIS — D239 Other benign neoplasm of skin, unspecified: Secondary | ICD-10-CM | POA: Diagnosis not present

## 2014-02-25 DIAGNOSIS — D1801 Hemangioma of skin and subcutaneous tissue: Secondary | ICD-10-CM | POA: Diagnosis not present

## 2014-02-25 DIAGNOSIS — Z85828 Personal history of other malignant neoplasm of skin: Secondary | ICD-10-CM | POA: Diagnosis not present

## 2014-02-25 DIAGNOSIS — L819 Disorder of pigmentation, unspecified: Secondary | ICD-10-CM | POA: Diagnosis not present

## 2014-03-02 DIAGNOSIS — H109 Unspecified conjunctivitis: Secondary | ICD-10-CM | POA: Diagnosis not present

## 2014-03-03 DIAGNOSIS — H11129 Conjunctival concretions, unspecified eye: Secondary | ICD-10-CM | POA: Diagnosis not present

## 2014-03-27 DIAGNOSIS — S61209A Unspecified open wound of unspecified finger without damage to nail, initial encounter: Secondary | ICD-10-CM | POA: Diagnosis not present

## 2014-03-28 DIAGNOSIS — Z23 Encounter for immunization: Secondary | ICD-10-CM | POA: Diagnosis not present

## 2014-03-30 DIAGNOSIS — C61 Malignant neoplasm of prostate: Secondary | ICD-10-CM | POA: Diagnosis not present

## 2014-04-06 DIAGNOSIS — N5231 Erectile dysfunction following radical prostatectomy: Secondary | ICD-10-CM | POA: Diagnosis not present

## 2014-04-06 DIAGNOSIS — Z8546 Personal history of malignant neoplasm of prostate: Secondary | ICD-10-CM | POA: Diagnosis not present

## 2014-05-05 DIAGNOSIS — M5416 Radiculopathy, lumbar region: Secondary | ICD-10-CM | POA: Diagnosis not present

## 2014-05-05 DIAGNOSIS — M47817 Spondylosis without myelopathy or radiculopathy, lumbosacral region: Secondary | ICD-10-CM | POA: Diagnosis not present

## 2014-09-01 DIAGNOSIS — M47817 Spondylosis without myelopathy or radiculopathy, lumbosacral region: Secondary | ICD-10-CM | POA: Diagnosis not present

## 2014-09-01 DIAGNOSIS — M79609 Pain in unspecified limb: Secondary | ICD-10-CM | POA: Diagnosis not present

## 2014-09-01 DIAGNOSIS — M5416 Radiculopathy, lumbar region: Secondary | ICD-10-CM | POA: Diagnosis not present

## 2014-09-01 DIAGNOSIS — E1340 Other specified diabetes mellitus with diabetic neuropathy, unspecified: Secondary | ICD-10-CM | POA: Diagnosis not present

## 2014-10-27 DIAGNOSIS — Z0000 Encounter for general adult medical examination without abnormal findings: Secondary | ICD-10-CM | POA: Diagnosis not present

## 2014-10-27 DIAGNOSIS — R7301 Impaired fasting glucose: Secondary | ICD-10-CM | POA: Diagnosis not present

## 2014-10-27 DIAGNOSIS — H6123 Impacted cerumen, bilateral: Secondary | ICD-10-CM | POA: Diagnosis not present

## 2014-10-27 DIAGNOSIS — G609 Hereditary and idiopathic neuropathy, unspecified: Secondary | ICD-10-CM | POA: Diagnosis not present

## 2014-10-27 DIAGNOSIS — E782 Mixed hyperlipidemia: Secondary | ICD-10-CM | POA: Diagnosis not present

## 2014-10-27 DIAGNOSIS — G458 Other transient cerebral ischemic attacks and related syndromes: Secondary | ICD-10-CM | POA: Diagnosis not present

## 2014-10-27 DIAGNOSIS — Z86010 Personal history of colonic polyps: Secondary | ICD-10-CM | POA: Diagnosis not present

## 2014-10-27 DIAGNOSIS — I10 Essential (primary) hypertension: Secondary | ICD-10-CM | POA: Diagnosis not present

## 2014-10-27 DIAGNOSIS — J449 Chronic obstructive pulmonary disease, unspecified: Secondary | ICD-10-CM | POA: Diagnosis not present

## 2014-10-27 DIAGNOSIS — C61 Malignant neoplasm of prostate: Secondary | ICD-10-CM | POA: Diagnosis not present

## 2014-10-27 DIAGNOSIS — L570 Actinic keratosis: Secondary | ICD-10-CM | POA: Diagnosis not present

## 2014-11-28 DIAGNOSIS — M67844 Other specified disorders of tendon, left hand: Secondary | ICD-10-CM | POA: Diagnosis not present

## 2014-11-28 DIAGNOSIS — M1812 Unilateral primary osteoarthritis of first carpometacarpal joint, left hand: Secondary | ICD-10-CM | POA: Diagnosis not present

## 2014-11-28 DIAGNOSIS — M720 Palmar fascial fibromatosis [Dupuytren]: Secondary | ICD-10-CM | POA: Diagnosis not present

## 2014-12-01 DIAGNOSIS — M461 Sacroiliitis, not elsewhere classified: Secondary | ICD-10-CM | POA: Diagnosis not present

## 2014-12-01 DIAGNOSIS — M4728 Other spondylosis with radiculopathy, sacral and sacrococcygeal region: Secondary | ICD-10-CM | POA: Diagnosis not present

## 2014-12-01 DIAGNOSIS — E1340 Other specified diabetes mellitus with diabetic neuropathy, unspecified: Secondary | ICD-10-CM | POA: Diagnosis not present

## 2014-12-01 DIAGNOSIS — M47817 Spondylosis without myelopathy or radiculopathy, lumbosacral region: Secondary | ICD-10-CM | POA: Diagnosis not present

## 2014-12-01 DIAGNOSIS — M5416 Radiculopathy, lumbar region: Secondary | ICD-10-CM | POA: Diagnosis not present

## 2015-03-01 DIAGNOSIS — D1800 Hemangioma unspecified site: Secondary | ICD-10-CM | POA: Diagnosis not present

## 2015-03-01 DIAGNOSIS — L570 Actinic keratosis: Secondary | ICD-10-CM | POA: Diagnosis not present

## 2015-03-01 DIAGNOSIS — Z85828 Personal history of other malignant neoplasm of skin: Secondary | ICD-10-CM | POA: Diagnosis not present

## 2015-03-01 DIAGNOSIS — L821 Other seborrheic keratosis: Secondary | ICD-10-CM | POA: Diagnosis not present

## 2015-03-01 DIAGNOSIS — D225 Melanocytic nevi of trunk: Secondary | ICD-10-CM | POA: Diagnosis not present

## 2015-03-01 DIAGNOSIS — L814 Other melanin hyperpigmentation: Secondary | ICD-10-CM | POA: Diagnosis not present

## 2015-03-01 DIAGNOSIS — D485 Neoplasm of uncertain behavior of skin: Secondary | ICD-10-CM | POA: Diagnosis not present

## 2015-03-02 DIAGNOSIS — M5416 Radiculopathy, lumbar region: Secondary | ICD-10-CM | POA: Diagnosis not present

## 2015-03-02 DIAGNOSIS — C44311 Basal cell carcinoma of skin of nose: Secondary | ICD-10-CM | POA: Diagnosis not present

## 2015-03-02 DIAGNOSIS — M47817 Spondylosis without myelopathy or radiculopathy, lumbosacral region: Secondary | ICD-10-CM | POA: Diagnosis not present

## 2015-03-23 DIAGNOSIS — Z23 Encounter for immunization: Secondary | ICD-10-CM | POA: Diagnosis not present

## 2015-03-28 DIAGNOSIS — C44311 Basal cell carcinoma of skin of nose: Secondary | ICD-10-CM | POA: Diagnosis not present

## 2015-03-31 DIAGNOSIS — C61 Malignant neoplasm of prostate: Secondary | ICD-10-CM | POA: Diagnosis not present

## 2015-04-07 DIAGNOSIS — Z8546 Personal history of malignant neoplasm of prostate: Secondary | ICD-10-CM | POA: Diagnosis not present

## 2015-04-07 DIAGNOSIS — N529 Male erectile dysfunction, unspecified: Secondary | ICD-10-CM | POA: Diagnosis not present

## 2015-05-05 DIAGNOSIS — R05 Cough: Secondary | ICD-10-CM | POA: Diagnosis not present

## 2015-05-05 DIAGNOSIS — J320 Chronic maxillary sinusitis: Secondary | ICD-10-CM | POA: Diagnosis not present

## 2015-05-05 DIAGNOSIS — J321 Chronic frontal sinusitis: Secondary | ICD-10-CM | POA: Diagnosis not present

## 2015-05-05 DIAGNOSIS — J322 Chronic ethmoidal sinusitis: Secondary | ICD-10-CM | POA: Diagnosis not present

## 2015-05-16 DIAGNOSIS — E119 Type 2 diabetes mellitus without complications: Secondary | ICD-10-CM | POA: Diagnosis not present

## 2015-05-16 DIAGNOSIS — H18603 Keratoconus, unspecified, bilateral: Secondary | ICD-10-CM | POA: Diagnosis not present

## 2015-05-16 DIAGNOSIS — H2513 Age-related nuclear cataract, bilateral: Secondary | ICD-10-CM | POA: Diagnosis not present

## 2015-05-22 ENCOUNTER — Other Ambulatory Visit: Payer: Self-pay | Admitting: Otolaryngology

## 2015-05-22 DIAGNOSIS — J329 Chronic sinusitis, unspecified: Secondary | ICD-10-CM | POA: Diagnosis not present

## 2015-05-22 DIAGNOSIS — J321 Chronic frontal sinusitis: Secondary | ICD-10-CM | POA: Diagnosis not present

## 2015-05-22 DIAGNOSIS — J322 Chronic ethmoidal sinusitis: Secondary | ICD-10-CM | POA: Diagnosis not present

## 2015-05-22 DIAGNOSIS — J320 Chronic maxillary sinusitis: Secondary | ICD-10-CM | POA: Diagnosis not present

## 2015-06-01 DIAGNOSIS — M533 Sacrococcygeal disorders, not elsewhere classified: Secondary | ICD-10-CM | POA: Diagnosis not present

## 2015-06-01 DIAGNOSIS — M47817 Spondylosis without myelopathy or radiculopathy, lumbosacral region: Secondary | ICD-10-CM | POA: Diagnosis not present

## 2015-06-01 DIAGNOSIS — M791 Myalgia: Secondary | ICD-10-CM | POA: Diagnosis not present

## 2015-06-01 DIAGNOSIS — M5416 Radiculopathy, lumbar region: Secondary | ICD-10-CM | POA: Diagnosis not present

## 2015-07-03 DIAGNOSIS — J320 Chronic maxillary sinusitis: Secondary | ICD-10-CM | POA: Diagnosis not present

## 2015-07-03 DIAGNOSIS — J321 Chronic frontal sinusitis: Secondary | ICD-10-CM | POA: Diagnosis not present

## 2015-07-03 DIAGNOSIS — J322 Chronic ethmoidal sinusitis: Secondary | ICD-10-CM | POA: Diagnosis not present

## 2015-08-16 DIAGNOSIS — R05 Cough: Secondary | ICD-10-CM | POA: Diagnosis not present

## 2015-08-16 DIAGNOSIS — J324 Chronic pansinusitis: Secondary | ICD-10-CM | POA: Diagnosis not present

## 2015-08-16 DIAGNOSIS — R0602 Shortness of breath: Secondary | ICD-10-CM | POA: Diagnosis not present

## 2015-08-31 DIAGNOSIS — M533 Sacrococcygeal disorders, not elsewhere classified: Secondary | ICD-10-CM | POA: Diagnosis not present

## 2015-08-31 DIAGNOSIS — M791 Myalgia: Secondary | ICD-10-CM | POA: Diagnosis not present

## 2015-08-31 DIAGNOSIS — M47817 Spondylosis without myelopathy or radiculopathy, lumbosacral region: Secondary | ICD-10-CM | POA: Diagnosis not present

## 2015-08-31 DIAGNOSIS — M5416 Radiculopathy, lumbar region: Secondary | ICD-10-CM | POA: Diagnosis not present

## 2015-10-04 DIAGNOSIS — M720 Palmar fascial fibromatosis [Dupuytren]: Secondary | ICD-10-CM | POA: Diagnosis not present

## 2015-10-18 DIAGNOSIS — J322 Chronic ethmoidal sinusitis: Secondary | ICD-10-CM | POA: Diagnosis not present

## 2015-10-18 DIAGNOSIS — J343 Hypertrophy of nasal turbinates: Secondary | ICD-10-CM | POA: Diagnosis not present

## 2015-10-18 DIAGNOSIS — H6981 Other specified disorders of Eustachian tube, right ear: Secondary | ICD-10-CM | POA: Diagnosis not present

## 2015-10-18 DIAGNOSIS — R05 Cough: Secondary | ICD-10-CM | POA: Diagnosis not present

## 2015-11-08 ENCOUNTER — Other Ambulatory Visit: Payer: Self-pay | Admitting: Family Medicine

## 2015-11-08 ENCOUNTER — Other Ambulatory Visit: Payer: Self-pay | Admitting: Pediatrics

## 2015-11-08 DIAGNOSIS — Z86010 Personal history of colonic polyps: Secondary | ICD-10-CM | POA: Diagnosis not present

## 2015-11-08 DIAGNOSIS — I779 Disorder of arteries and arterioles, unspecified: Secondary | ICD-10-CM | POA: Diagnosis not present

## 2015-11-08 DIAGNOSIS — G458 Other transient cerebral ischemic attacks and related syndromes: Secondary | ICD-10-CM | POA: Diagnosis not present

## 2015-11-08 DIAGNOSIS — L570 Actinic keratosis: Secondary | ICD-10-CM | POA: Diagnosis not present

## 2015-11-08 DIAGNOSIS — Z0000 Encounter for general adult medical examination without abnormal findings: Secondary | ICD-10-CM | POA: Diagnosis not present

## 2015-11-08 DIAGNOSIS — R7301 Impaired fasting glucose: Secondary | ICD-10-CM | POA: Diagnosis not present

## 2015-11-08 DIAGNOSIS — I739 Peripheral vascular disease, unspecified: Principal | ICD-10-CM

## 2015-11-08 DIAGNOSIS — M720 Palmar fascial fibromatosis [Dupuytren]: Secondary | ICD-10-CM | POA: Diagnosis not present

## 2015-11-08 DIAGNOSIS — I10 Essential (primary) hypertension: Secondary | ICD-10-CM | POA: Diagnosis not present

## 2015-11-08 DIAGNOSIS — G609 Hereditary and idiopathic neuropathy, unspecified: Secondary | ICD-10-CM | POA: Diagnosis not present

## 2015-11-08 DIAGNOSIS — E782 Mixed hyperlipidemia: Secondary | ICD-10-CM | POA: Diagnosis not present

## 2015-11-08 DIAGNOSIS — Z8546 Personal history of malignant neoplasm of prostate: Secondary | ICD-10-CM | POA: Diagnosis not present

## 2015-11-08 DIAGNOSIS — J449 Chronic obstructive pulmonary disease, unspecified: Secondary | ICD-10-CM | POA: Diagnosis not present

## 2015-11-21 ENCOUNTER — Ambulatory Visit
Admission: RE | Admit: 2015-11-21 | Discharge: 2015-11-21 | Disposition: A | Discharge status: Home / Self Care | Payer: Medicare Other | Source: Ambulatory Visit | Attending: Family Medicine | Admitting: Family Medicine

## 2015-11-21 DIAGNOSIS — I739 Peripheral vascular disease, unspecified: Principal | ICD-10-CM

## 2015-11-21 DIAGNOSIS — M170 Bilateral primary osteoarthritis of knee: Secondary | ICD-10-CM | POA: Diagnosis not present

## 2015-11-21 DIAGNOSIS — I6523 Occlusion and stenosis of bilateral carotid arteries: Secondary | ICD-10-CM | POA: Diagnosis not present

## 2015-11-21 DIAGNOSIS — I779 Disorder of arteries and arterioles, unspecified: Secondary | ICD-10-CM

## 2015-11-30 DIAGNOSIS — M791 Myalgia: Secondary | ICD-10-CM | POA: Diagnosis not present

## 2015-11-30 DIAGNOSIS — M47817 Spondylosis without myelopathy or radiculopathy, lumbosacral region: Secondary | ICD-10-CM | POA: Diagnosis not present

## 2015-11-30 DIAGNOSIS — M5416 Radiculopathy, lumbar region: Secondary | ICD-10-CM | POA: Diagnosis not present

## 2015-11-30 DIAGNOSIS — M533 Sacrococcygeal disorders, not elsewhere classified: Secondary | ICD-10-CM | POA: Diagnosis not present

## 2015-12-05 ENCOUNTER — Telehealth: Payer: Self-pay | Admitting: Acute Care

## 2015-12-05 NOTE — Telephone Encounter (Signed)
Estill Bamberg, do you a have a referral on him?

## 2015-12-06 NOTE — Telephone Encounter (Signed)
Pt call back about CT for lung screening.[name]

## 2015-12-07 ENCOUNTER — Telehealth: Payer: Self-pay | Admitting: Acute Care

## 2015-12-07 DIAGNOSIS — Z87891 Personal history of nicotine dependence: Secondary | ICD-10-CM

## 2015-12-11 NOTE — Telephone Encounter (Signed)
Received lung cancer screening referral and pt has been placed on call list.

## 2015-12-20 NOTE — Telephone Encounter (Signed)
Lmtcb x1

## 2015-12-21 ENCOUNTER — Telehealth: Payer: Self-pay | Admitting: Adult Health

## 2015-12-21 NOTE — Telephone Encounter (Signed)
Patient returned call after hours yesterday (recd by fax).  I called and spoke with the patient's wife, Remo Lipps, and she stated he was returning a call to make appointment for lung cancer screening.  May call him today at [phone] (cell) or [phone] (home).

## 2015-12-28 NOTE — Telephone Encounter (Signed)
Spoke with Rodena Piety. Pt will need to be rescheduled for his SDMV for 9/26 at 9:30 ATC pt LVM for him to return call.

## 2015-12-28 NOTE — Telephone Encounter (Signed)
SDMV scheduled for 7.26.17 @ 12N Order for LDCT placed in this encounter Nothing further needed; will sign off

## 2015-12-28 NOTE — Telephone Encounter (Signed)
Duplicate message. Will sign off.  

## 2016-01-01 NOTE — Telephone Encounter (Signed)
Called spoke with pt. He states that he was trying to get his SDMV moved to 01/17/16 at 9:30 because his CT is scheduled for 10:45am on 01/17/16. I explained to him that I would speak with Rodena Piety because there is no availabilty for an ov on 01/17/16 prior to his 12pm. He voiced understanding and had no further questions.

## 2016-01-01 NOTE — Telephone Encounter (Signed)
Spoke with Rodena Piety and she states that she will contact the pt and reschedule his SDMV and CT.  Will forward message to [facility] for follow up.

## 2016-01-12 ENCOUNTER — Telehealth: Payer: Self-pay | Admitting: Acute Care

## 2016-01-12 NOTE — Telephone Encounter (Signed)
Need to reschedule SDMV on 01/17/16 due to SG being out of office LVM for pt to return call.

## 2016-01-17 ENCOUNTER — Ambulatory Visit (HOSPITAL_COMMUNITY)
Admission: RE | Admit: 2016-01-17 | Discharge: 2016-01-17 | Disposition: A | Discharge status: Home / Self Care | Payer: Medicare Other | Source: Ambulatory Visit | Attending: Acute Care | Admitting: Acute Care

## 2016-01-17 ENCOUNTER — Inpatient Hospital Stay: Admission: RE | Admit: 2016-01-17 | Payer: Medicare Other | Source: Ambulatory Visit

## 2016-01-17 ENCOUNTER — Encounter (HOSPITAL_COMMUNITY): Payer: Self-pay

## 2016-01-17 ENCOUNTER — Encounter: Payer: Medicare Other | Admitting: Acute Care

## 2016-01-17 DIAGNOSIS — Z87891 Personal history of nicotine dependence: Secondary | ICD-10-CM | POA: Diagnosis not present

## 2016-01-17 DIAGNOSIS — Z122 Encounter for screening for malignant neoplasm of respiratory organs: Secondary | ICD-10-CM | POA: Diagnosis not present

## 2016-01-17 DIAGNOSIS — J439 Emphysema, unspecified: Secondary | ICD-10-CM | POA: Insufficient documentation

## 2016-01-17 NOTE — Telephone Encounter (Signed)
Pt showed and was arrived for [facility] appt w/ SG today SG not in the office this week Appt rescheduled for 1st available on 8.17.17 @ 1130 Pt is already scheduled for his LDCT today @ 1pm and will keep this appt  Nothing further needed; will sign off

## 2016-01-26 ENCOUNTER — Other Ambulatory Visit: Payer: Self-pay | Admitting: Acute Care

## 2016-01-26 ENCOUNTER — Telehealth: Payer: Self-pay | Admitting: Acute Care

## 2016-01-26 DIAGNOSIS — Z87891 Personal history of nicotine dependence: Secondary | ICD-10-CM

## 2016-01-26 NOTE — Telephone Encounter (Signed)
I have called [name] with the results of his LDCT. I explained that his scan was read as a Lung  RADS 3, nodules that are probably benign findings, short term follow up suggested: includes nodules with a low likelihood of becoming a clinically active cancer. Radiology recommends a 6 month repeat LDCT follow up. I also explained that his scan indicated imaging changes that are likely related to a chronic atypical infection.( MAI). He verbalized understanding of the above. He states that he did have sinus surgery in Jan/ Feb.of this year, but does still continue to cough. I told him we will call him next week to schedule an appointment to follow up regarding the suspected MAI. I explained that we will schedule his repeat CT scan for 6 months from now. He understands the follow up plan for both of the findings, and had no further questions upon completion of the call.

## 2016-02-05 ENCOUNTER — Telehealth: Payer: Self-pay | Admitting: Acute Care

## 2016-02-05 NOTE — Telephone Encounter (Signed)
I called and left a message for Dr. Rex Kras, the patient's  PCP, letting him know the results of the patient's CT and suggesting a pulmonary consult for the incidental finding of MAI. I explained that the scan was read as a Lung  RADS 3, indicating nodules that are probably benign findings, short term follow up suggested: includes nodules with a low likelihood of becoming a clinically active cancer. Radiology recommends a 6 month repeat LDCT follow up.I explained that the follow up scan has been ordered, and that the patient has been notified of the results. I have asked that they refer for a pulmonary consult if they would like [place] to see the patient for the MAI, and have left our contact information.

## 2016-02-07 DIAGNOSIS — I779 Disorder of arteries and arterioles, unspecified: Secondary | ICD-10-CM | POA: Diagnosis not present

## 2016-02-07 DIAGNOSIS — Z86010 Personal history of colonic polyps: Secondary | ICD-10-CM | POA: Diagnosis not present

## 2016-02-07 DIAGNOSIS — R7309 Other abnormal glucose: Secondary | ICD-10-CM | POA: Diagnosis not present

## 2016-02-07 DIAGNOSIS — Z8546 Personal history of malignant neoplasm of prostate: Secondary | ICD-10-CM | POA: Diagnosis not present

## 2016-02-07 DIAGNOSIS — J449 Chronic obstructive pulmonary disease, unspecified: Secondary | ICD-10-CM | POA: Diagnosis not present

## 2016-02-07 DIAGNOSIS — Z87891 Personal history of nicotine dependence: Secondary | ICD-10-CM | POA: Diagnosis not present

## 2016-02-07 DIAGNOSIS — I10 Essential (primary) hypertension: Secondary | ICD-10-CM | POA: Diagnosis not present

## 2016-02-07 DIAGNOSIS — E782 Mixed hyperlipidemia: Secondary | ICD-10-CM | POA: Diagnosis not present

## 2016-02-07 DIAGNOSIS — G458 Other transient cerebral ischemic attacks and related syndromes: Secondary | ICD-10-CM | POA: Diagnosis not present

## 2016-02-07 DIAGNOSIS — L570 Actinic keratosis: Secondary | ICD-10-CM | POA: Diagnosis not present

## 2016-02-07 DIAGNOSIS — G609 Hereditary and idiopathic neuropathy, unspecified: Secondary | ICD-10-CM | POA: Diagnosis not present

## 2016-02-07 DIAGNOSIS — Z0000 Encounter for general adult medical examination without abnormal findings: Secondary | ICD-10-CM | POA: Diagnosis not present

## 2016-02-08 ENCOUNTER — Ambulatory Visit (INDEPENDENT_AMBULATORY_CARE_PROVIDER_SITE_OTHER): Payer: Medicare Other | Admitting: Acute Care

## 2016-02-08 ENCOUNTER — Encounter: Payer: Self-pay | Admitting: Acute Care

## 2016-02-08 DIAGNOSIS — Z87891 Personal history of nicotine dependence: Secondary | ICD-10-CM | POA: Diagnosis not present

## 2016-02-08 DIAGNOSIS — J324 Chronic pansinusitis: Secondary | ICD-10-CM | POA: Diagnosis not present

## 2016-02-08 DIAGNOSIS — J343 Hypertrophy of nasal turbinates: Secondary | ICD-10-CM | POA: Diagnosis not present

## 2016-02-08 DIAGNOSIS — R05 Cough: Secondary | ICD-10-CM | POA: Diagnosis not present

## 2016-02-08 NOTE — Progress Notes (Signed)
Shared Decision Making Visit Lung Cancer Screening Program [phone])   Eligibility:  Age 75 y.o.  Pack Years Smoking History Calculation 80 pack year smoking history (# packs/per year x # years smoked)  Recent History of coughing up blood  no  Unexplained weight loss? no ( >Than 15 pounds within the last 6 months )  Prior History Lung / other cancer no (Diagnosis within the last 5 years already requiring surveillance chest CT Scans).  Smoking Status Former Smoker  Former Smokers: Years since quit: 11 years  Quit Date:06/24/2004  Visit Components:  Discussion included one or more decision making aids. yes  Discussion included risk/benefits of screening. yes  Discussion included potential follow up diagnostic testing for abnormal scans. yes  Discussion included meaning and risk of over diagnosis. yes  Discussion included meaning and risk of False Positives. yes  Discussion included meaning of total radiation exposure. yes  Counseling Included:  Importance of adherence to annual lung cancer LDCT screening. yes  Impact of comorbidities on ability to participate in the program. yes  Ability and willingness to under diagnostic treatment. yes  Smoking Cessation Counseling:  Current Smokers:   Discussed importance of smoking cessation. NA; former smoker  Information about tobacco cessation classes and interventions provided to patient. NA  Patient provided with "ticket" for LDCT Scan. yes  Symptomatic Patient. no  Counseling  Diagnosis Code: Tobacco Use Z72.0  Asymptomatic Patient yes  Counseling [id number] smoker  Former Smokers:   Discussed the importance of maintaining cigarette abstinence. yes  Diagnosis Code: Personal History of Nicotine Dependence. [id number]  Information about tobacco cessation classes and interventions provided to patient. Yes  Patient provided with "ticket" for LDCT Scan. yes  Written Order for Lung Cancer Screening with LDCT placed  in Epic. Yes (CT Chest Lung Cancer Screening Low Dose W/O CM) [id number] Z12.2-Screening of respiratory organs Z87.891-Personal history of nicotine dependence  I spent 20 minutes of face to face time with Mr. Kristiansen discussing the risks and benefits of lung cancer screening. We viewed a power point together that explained in detail the above noted topics. We took the time to pause the power point at intervals to allow for questions to be asked and answered to ensure understanding. We discussed that he had taken the single most powerful action possible to decrease his risk of developing lung cancer when he quit smoking. I counseled him to remain smoke free, and to contact me if he ever had the desire to smoke again so that I can provide resources and tools to help support the effort to remain smoke free. We discussed the time and location of the scan, and that either Iselin or I will call with the results within  24-48 hours of receiving them. He has my card and contact information in the event he needs to speak with me, in addition to a copy of the power point we reviewed as a resource. He verbalized understanding of all of the above and had no further questions upon leaving the office.   Magdalen Spatz, NP 02/08/2016

## 2016-03-08 ENCOUNTER — Ambulatory Visit (INDEPENDENT_AMBULATORY_CARE_PROVIDER_SITE_OTHER): Payer: Medicare Other | Admitting: Internal Medicine

## 2016-03-08 ENCOUNTER — Encounter: Payer: Self-pay | Admitting: Internal Medicine

## 2016-03-08 VITALS — BP 120/64 | HR 71 | Ht 68.0 in | Wt 178.6 lb

## 2016-03-08 DIAGNOSIS — J479 Bronchiectasis, uncomplicated: Secondary | ICD-10-CM

## 2016-03-08 DIAGNOSIS — R059 Cough, unspecified: Secondary | ICD-10-CM

## 2016-03-08 DIAGNOSIS — R05 Cough: Secondary | ICD-10-CM | POA: Diagnosis not present

## 2016-03-08 MED ORDER — BUDESONIDE-FORMOTEROL FUMARATE 80-4.5 MCG/ACT IN AERO: 2.0000 | INHALATION_SPRAY | Freq: Two times a day (BID) | RESPIRATORY_TRACT | 0 refills | Status: DC

## 2016-03-08 MED ORDER — BUDESONIDE-FORMOTEROL FUMARATE 80-4.5 MCG/ACT IN AERO: 2.0000 | INHALATION_SPRAY | Freq: Two times a day (BID) | RESPIRATORY_TRACT | 11 refills | Status: DC

## 2016-03-08 NOTE — Patient Instructions (Addendum)
Bronchiectasis =   you have scarring of your bronchial tubes which means that they don't function perfectly normally and mucus tends to pool in certain areas of your lung which can cause pneumonia and further scarring of your lung and bronchial tubes  Whenever you develop cough congestion take mucinex or mucinex dm 1200 mg every 12 hours  > these will help keep the mucus loose and flowing but if your condition worsens you need to seek help immediately preferably here or somewhere inside the [facility] system to compare xrays ( worse = darker or bloody mucus or pain on breathing in)    Symbicort 80 Take 2 puffs first thing in am and then another 2 puffs about 12 hours later.    Work on inhaler technique:  relax and gently blow all the way out then take a nice smooth deep breath back in, triggering the inhaler at same time you start breathing in.  Hold for up to 5 seconds if you can. Blow out thru nose. Rinse and gargle with water when done       Please schedule a follow up office visit in 6 weeks, call sooner if needed with pfts - ok to push back so we do both same day

## 2016-03-08 NOTE — Progress Notes (Signed)
Subjective:    Patient ID: [name], male    DOB: Aug 24, 1940  MRN: [id number]    Brief patient profile:  75 yowm quit smoking 2006 with chronic cough at that point which completely resolved p quit smoking although continued then and now to do wood working with onset new cough while on ACEi Dec 2012   Referred 01/08/2013 to pulmonary clinic by Dr Janace Hoard for cough.   History of Present Illness  01/08/2013 1st pulmonary eval/[name] stopped acei in April 2014 cc cough daily x 1.5 y w/in10 min of stirring and brings up tan mucus maybe a half a cup total  by lunch, seems worse sitting. Some sinus drainage better with allegra.  Does not remember abx or prednisone being used and w/u by Dr Janace Hoard was c/w chronic rinitis and ? gerd but  Not taking meds consistently. rec Prednisone 10 mg take  4 each am x 2 days,   2 each am x 2 days,  1 each am x 2 days and stop  Pantoprazole (protonix) 40 mg   Take 30-60 min before first meal of the day and Pepcid 20 mg one bedtime and chlortrimeton 4mg  at bedtime until return to office -  GERD diet. For cough use delsym or mucinex dm to suppress the urge to cough    01/22/2013 f/u ov/[name] re chronic cough Chief Complaint  Patient presents with  . Follow-up    Pt states cough is mcuh improved since his last visit. Still coughing up min light yellow sputum a couple times per day. No new co's.  not limted by sob, most of his mucus prod in ams but not premature awakending. Using netti pot but not nasonex (sometimes use it prn when "nose bad") rec Start back on nasonex twice daily  If flare ok to take Prednisone 10 mg take  4 each am x 2 days,   2 each am x 2 days,  1 each am x 2 days and stop    03/08/2013 f/u ov/[name] re chronic cough Chief Complaint  Patient presents with  . 6 Week Follow Up    Cough is still present, unchanged. Reports production of yellow/green mucus. Cough is consistent throughout the day.   did not stay on meds as rec, confused with names of  meds he is on. No noct disturbance at all. Says "zero" response to everything we've done - not even transiently better with prednisone  rec  repeat sinus CT > chronic changes/ no air fluid levels  Stop cozar (losaartan) and start micardis 40 mg sa mples take one daily  Stop nasonex and start dymista one twice daily each nostril  For cough > mucinex dm 1200 mg every 12 hours as needed    04/05/2013 f/u ov/[name] re: cough x 2 years Chief Complaint  Patient presents with  . Follow-up    Pt states cough had improved until he developed sinus infection approx 5 days after his last visit. Cough is prod with moderate amount tan/yellow sputum.  He also c/o minoe sore throat.   cough is worse early in am  And also in pm if uses his voice. Still using vit D oil based vit dymista helping nasal drainage better than anything else. Did not maintain on gerd rx  rec augmentin 875 twice daily x 10 days Prednisone 10 mg take  4 each am x 2 days, 2 each am x 2 days,  1 each am x 2 days and stop  Take pepcid  20mg  and chlortrimeton 4 mg at bedtime as long as coughing Continue dymista and add singulair 10 mg daily for at least a month and  stop if not helping your cough or nose  GERD diet  If not satisfied then next step is to return to Dr Janace Hoard    03/08/2016  Consultation/ [name] re:  Abnormal CT chest c/w bronchiectasis  Chief Complaint  Patient presents with  . Pulmonary Consult    Referred by Dr. Hulan Fess. Pt last seen Oct 2014. He had sinus surgery in Jan 2017 and his cough has improved some since then. He has occ cough with yellow sputum.    Cough never completely resolved x 2012   is most prodcutive p stirs in am / always yellow even p abx/ one episode of hemotpysis x 3 m prior to OV but this was in setting of epistaxis and ongoing nasal congestion that only improved transiently p sinus surgery   No obvious day to day or daytime variability or assoc  Sob or cp or chest tightness, subjective wheeze  or overt  hb symptoms. No unusual exp hx or h/o childhood pna/ asthma or knowledge of premature birth.  Sleeping ok without nocturnal  or early am exacerbation  of respiratory  c/o's or need for noct saba. Also denies any obvious fluctuation of symptoms with weather or environmental changes or other aggravating or alleviating factors except as outlined above   Current Medications, Allergies, Complete Past Medical History, Past Surgical History, Family History, and Social History were reviewed in [employer] record.  ROS  The following are not active complaints unless bolded sore throat, dysphagia, dental problems, itching, sneezing,  nasal congestion or excess/ purulent secretions, ear ache,   fever, chills, sweats, unintended wt loss, classically pleuritic or exertional cp, hemoptysis,  orthopnea pnd or leg swelling, presyncope, palpitations, abdominal pain, anorexia, nausea, vomiting, diarrhea  or change in bowel or bladder habits, change in stools or urine, dysuria,hematuria,  rash, arthralgias, visual complaints, headache, numbness, weakness or ataxia or problems with walking or coordination,  change in mood/affect or memory.                      Objective:   Physical Exam   amb wm with nasal tone to voice   Vital signs reviewed  - note sats 95% RA on arrival   03/08/2013       200 > 04/05/2013   194 >  03/08/2016  179     01/22/13 199 lb 12.8 oz (90.629 kg)  01/08/13 197 lb (89.359 kg)      amb pleasant wm nad with distinct nasal tone to voice and barking upper airway pattern dry cough  HEENT: nl dentition, turbinates, and orophanx. Nl external ear canals without cough reflex   NECK :  without JVD/Nodes/TM/ nl carotid upstrokes bilaterally   LUNGS: no acc muscle use, clear to A and P bilaterally without cough on insp or exp maneuvers   CV:  RRR  no s3 or murmur or increase in P2, no edema   ABD:  soft and nontender with nl excursion in the  supine position. No bruits or organomegaly, bowel sounds nl  MS:  warm without deformities, calf tenderness, cyanosis or clubbing  SKIN: warm and dry without lesions      CT chest 01/17/16  (low dose screening) Emphysema with innumerable peripheral tiny pulmonary nodules scattered mainly in a peribronchovascular distribution and associated with bronchial wall thickening,  bronchiectasis, and airway impaction. These changes are associated with areas of irregular airspace consolidation mainly in the dependent lower lobes bilaterally        Assessment & Plan:

## 2016-03-08 NOTE — Assessment & Plan Note (Deleted)
-   The proper method of use, as well as anticipated side effects, of a metered-dose inhaler are discussed and demonstrated to the patient. Improved effectiveness after extensive coaching during this visit to a level of approximately 90 % from a baseline of 75 % > try symbicort 80 2bid then return for pfts

## 2016-03-09 DIAGNOSIS — R918 Other nonspecific abnormal finding of lung field: Secondary | ICD-10-CM | POA: Insufficient documentation

## 2016-03-09 NOTE — Assessment & Plan Note (Addendum)
Reviewed the pathophysiology of bronchiectasis and why it causes refractory cough and tendency to opportuniistic infections including MAI which are very hard to eradicate and best approach for now is to try to improve MC fxn with low dose LABA/ICS then return for pfts to see to what extent this is obstructive bronchiectasis which can mimic copd    - The proper method of use, as well as anticipated side effects, of a metered-dose inhaler are discussed and demonstrated to the patient. Improved effectiveness after extensive coaching during this visit to a level of approximately 90 % from a baseline of 75 % > try symbicort 80 2bid then return for pfts   Total time devoted to counseling  = 35/71m review case with pt/ discussion of options/alternatives/ personally creating written instructions  in presence of pt  then going over those specific  Instructions directly with the pt including how to use all of the meds but in particular covering each new medication in detail and the difference between the maintenance/automatic meds and the prns using an action plan format for the latter.

## 2016-03-12 DIAGNOSIS — Z23 Encounter for immunization: Secondary | ICD-10-CM | POA: Diagnosis not present

## 2016-03-18 DIAGNOSIS — C61 Malignant neoplasm of prostate: Secondary | ICD-10-CM | POA: Diagnosis not present

## 2016-03-27 DIAGNOSIS — N5201 Erectile dysfunction due to arterial insufficiency: Secondary | ICD-10-CM | POA: Diagnosis not present

## 2016-03-27 DIAGNOSIS — Z8546 Personal history of malignant neoplasm of prostate: Secondary | ICD-10-CM | POA: Diagnosis not present

## 2016-03-29 DIAGNOSIS — M533 Sacrococcygeal disorders, not elsewhere classified: Secondary | ICD-10-CM | POA: Diagnosis not present

## 2016-03-29 DIAGNOSIS — M5416 Radiculopathy, lumbar region: Secondary | ICD-10-CM | POA: Diagnosis not present

## 2016-03-29 DIAGNOSIS — M47817 Spondylosis without myelopathy or radiculopathy, lumbosacral region: Secondary | ICD-10-CM | POA: Diagnosis not present

## 2016-03-29 DIAGNOSIS — M791 Myalgia: Secondary | ICD-10-CM | POA: Diagnosis not present

## 2016-04-03 DIAGNOSIS — M720 Palmar fascial fibromatosis [Dupuytren]: Secondary | ICD-10-CM | POA: Diagnosis not present

## 2016-04-09 DIAGNOSIS — L821 Other seborrheic keratosis: Secondary | ICD-10-CM | POA: Diagnosis not present

## 2016-04-09 DIAGNOSIS — D1801 Hemangioma of skin and subcutaneous tissue: Secondary | ICD-10-CM | POA: Diagnosis not present

## 2016-04-09 DIAGNOSIS — L814 Other melanin hyperpigmentation: Secondary | ICD-10-CM | POA: Diagnosis not present

## 2016-04-09 DIAGNOSIS — Z23 Encounter for immunization: Secondary | ICD-10-CM | POA: Diagnosis not present

## 2016-04-09 DIAGNOSIS — D485 Neoplasm of uncertain behavior of skin: Secondary | ICD-10-CM | POA: Diagnosis not present

## 2016-04-09 DIAGNOSIS — L570 Actinic keratosis: Secondary | ICD-10-CM | POA: Diagnosis not present

## 2016-04-11 DIAGNOSIS — L570 Actinic keratosis: Secondary | ICD-10-CM | POA: Diagnosis not present

## 2016-04-25 ENCOUNTER — Encounter: Payer: Self-pay | Admitting: Internal Medicine

## 2016-04-25 ENCOUNTER — Ambulatory Visit (INDEPENDENT_AMBULATORY_CARE_PROVIDER_SITE_OTHER): Payer: Medicare Other | Admitting: Internal Medicine

## 2016-04-25 VITALS — BP 140/74 | HR 72 | Ht 68.5 in | Wt 178.5 lb

## 2016-04-25 DIAGNOSIS — J449 Chronic obstructive pulmonary disease, unspecified: Secondary | ICD-10-CM

## 2016-04-25 DIAGNOSIS — R05 Cough: Secondary | ICD-10-CM | POA: Diagnosis not present

## 2016-04-25 DIAGNOSIS — R059 Cough, unspecified: Secondary | ICD-10-CM

## 2016-04-25 DIAGNOSIS — R918 Other nonspecific abnormal finding of lung field: Secondary | ICD-10-CM

## 2016-04-25 LAB — PULMONARY FUNCTION TEST
DL/VA % pred: 76 %
DL/VA: 3.43 ml/min/mmHg/L
DLCO UNC: 20.39 ml/min/mmHg
DLCO cor % pred: 66 %
DLCO cor: 20.22 ml/min/mmHg
DLCO unc % pred: 67 %
FEF 25-75 PRE: 1.02 L/s
FEF 25-75 Post: 1.02 L/sec
FEF2575-%Change-Post: 0 %
FEF2575-%Pred-Post: 49 %
FEF2575-%Pred-Pre: 49 %
FEV1-%CHANGE-POST: 4 %
FEV1-%PRED-POST: 67 %
FEV1-%PRED-PRE: 64 %
FEV1-POST: 1.94 L
FEV1-PRE: 1.85 L
FEV1FVC-%Change-Post: 4 %
FEV1FVC-%Pred-Pre: 86 %
FEV6-%CHANGE-POST: -1 %
FEV6-%PRED-POST: 77 %
FEV6-%Pred-Pre: 77 %
FEV6-POST: 2.85 L
FEV6-PRE: 2.89 L
FEV6FVC-%CHANGE-POST: 0 %
FEV6FVC-%PRED-POST: 103 %
FEV6FVC-%PRED-PRE: 104 %
FVC-%CHANGE-POST: 0 %
FVC-%Pred-Post: 75 %
FVC-%Pred-Pre: 74 %
FVC-Post: 2.99 L
FVC-Pre: 2.97 L
PRE FEV6/FVC RATIO: 97 %
Post FEV1/FVC ratio: 65 %
Post FEV6/FVC ratio: 97 %
Pre FEV1/FVC ratio: 62 %
RV % PRED: 130 %
RV: 3.26 L
TLC % PRED: 97 %
TLC: 6.57 L

## 2016-04-25 MED ORDER — GLYCOPYRROLATE-FORMOTEROL 9-4.8 MCG/ACT IN AERO: 2.0000 | INHALATION_SPRAY | Freq: Two times a day (BID) | RESPIRATORY_TRACT | 11 refills | Status: DC

## 2016-04-25 MED ORDER — FLUTTER DEVI: 0 refills | Status: AC

## 2016-04-25 MED ORDER — GLYCOPYRROLATE-FORMOTEROL 9-4.8 MCG/ACT IN AERO: 2.0000 | INHALATION_SPRAY | Freq: Two times a day (BID) | RESPIRATORY_TRACT | 0 refills | Status: DC

## 2016-04-25 NOTE — Progress Notes (Signed)
Subjective:    Patient ID: [name], male    DOB: 1941/04/15  MRN: [id number]    Brief patient profile:  75 yowm quit smoking 2006 with chronic cough at that point which completely resolved p quit smoking although continued then and now to do wood working with onset new cough while on ACEi Dec 2012   Referred 01/08/2013 to pulmonary clinic by Dr Janace Hoard for cough.      History of Present Illness  01/08/2013 1st pulmonary eval/[name] stopped acei in April 2014 cc cough daily x 1.5 y w/in10 min of stirring and brings up tan mucus maybe a half a cup total  by lunch, seems worse sitting. Some sinus drainage better with allegra.  Does not remember abx or prednisone being used and w/u by Dr Janace Hoard was c/w chronic rinitis and ? gerd but  Not taking meds consistently. rec Prednisone 10 mg take  4 each am x 2 days,   2 each am x 2 days,  1 each am x 2 days and stop  Pantoprazole (protonix) 40 mg   Take 30-60 min before first meal of the day and Pepcid 20 mg one bedtime and chlortrimeton 4mg  at bedtime until return to office -  GERD diet. For cough use delsym or mucinex dm to suppress the urge to cough    01/22/2013 f/u ov/[name] re chronic cough Chief Complaint  Patient presents with  . Follow-up    Pt states cough is mcuh improved since his last visit. Still coughing up min light yellow sputum a couple times per day. No new co's.  not limted by sob, most of his mucus prod in ams but not premature awakending. Using netti pot but not nasonex (sometimes use it prn when "nose bad") rec Start back on nasonex twice daily  If flare ok to take Prednisone 10 mg take  4 each am x 2 days,   2 each am x 2 days,  1 each am x 2 days and stop    03/08/2013 f/u ov/[name] re chronic cough Chief Complaint  Patient presents with  . 6 Week Follow Up    Cough is still present, unchanged. Reports production of yellow/green mucus. Cough is consistent throughout the day.   did not stay on meds as rec, confused with  names of meds he is on. No noct disturbance at all. Says "zero" response to everything we've done - not even transiently better with prednisone  rec  repeat sinus CT > chronic changes/ no air fluid levels  Stop cozar (losaartan) and start micardis 40 mg sa mples take one daily  Stop nasonex and start dymista one twice daily each nostril  For cough > mucinex dm 1200 mg every 12 hours as needed    04/05/2013 f/u ov/[name] re: cough x 2 years Chief Complaint  Patient presents with  . Follow-up    Pt states cough had improved until he developed sinus infection approx 5 days after his last visit. Cough is prod with moderate amount tan/yellow sputum.  He also c/o minoe sore throat.   cough is worse early in am  And also in pm if uses his voice. Still using vit D oil based vit dymista helping nasal drainage better than anything else. Did not maintain on gerd rx  rec augmentin 875 twice daily x 10 days Prednisone 10 mg take  4 each am x 2 days, 2 each am x 2 days,  1 each am x 2 days and stop  Take pepcid 20mg  and chlortrimeton 4 mg at bedtime as long as coughing Continue dymista and add singulair 10 mg daily for at least a month and  stop if not helping your cough or nose  GERD diet  If not satisfied then next step is to return to Dr Janace Hoard    03/08/2016  Consultation/ [name] re:  Abnormal CT chest c/w bronchiectasis  Chief Complaint  Patient presents with  . Pulmonary Consult    Referred by Dr. Hulan Fess. Pt last seen Oct 2014. He had sinus surgery in Jan 2017 and his cough has improved some since then. He has occ cough with yellow sputum.    Cough never completely resolved x 2012   is most prod p stirs in am / always yellow even p abx/ one episode of hemotpysis x 3 m prior to OV but this was in setting of epistaxis and ongoing nasal congestion that only improved transiently p sinus surgery rec Bronchiectasis  pathophys Whenever you develop cough congestion take mucinex or mucinex dm 1200 mg  every 12 hours  Symbicort 80 Take 2 puffs first thing in am and then another 2 puffs about 12 hours later.  Work on inhaler technique:  Please schedule a follow up office visit in 6 weeks, call sooner if needed with pfts - ok to push back so we do both same day     04/25/2016  f/u ov/[name] re:  copd GOLD II / MPNs with bronchiectasis  Chief Complaint  Patient presents with  . Follow-up    PFT's today. Cough has been about the same. No new co's today.  no better on symbicort / same cough still following up with ent on rx for sinus dz  No obvious day to day or daytime variability or assoc  Sob or cp or chest tightness, subjective wheeze or overt  hb symptoms. No unusual exp hx or h/o childhood pna/ asthma or knowledge of premature birth.  Sleeping ok without nocturnal  or early am exacerbation  of respiratory  c/o's or need for noct saba. Also denies any obvious fluctuation of symptoms with weather or environmental changes or other aggravating or alleviating factors except as outlined above   Current Medications, Allergies, Complete Past Medical History, Past Surgical History, Family History, and Social History were reviewed in [employer] record.  ROS  The following are not active complaints unless bolded sore throat, dysphagia, dental problems, itching, sneezing,  nasal congestion or excess/ purulent secretions, ear ache,   fever, chills, sweats, unintended wt loss, classically pleuritic or exertional cp, hemoptysis,  orthopnea pnd or leg swelling, presyncope, palpitations, abdominal pain, anorexia, nausea, vomiting, diarrhea  or change in bowel or bladder habits, change in stools or urine, dysuria,hematuria,  rash, arthralgias, visual complaints, headache, numbness, weakness or ataxia or problems with walking or coordination,  change in mood/affect or memory.              Objective:   Physical Exam   amb wm    Vital signs reviewed   - Note on arrival 02 sats   93 % on RA     03/08/2013       200 > 04/05/2013   194 >  03/08/2016  179  > 04/25/2016  178     01/22/13 199 lb 12.8 oz (90.629 kg)  01/08/13 197 lb (89.359 kg)       HEENT: nl dentition, turbinates, and orophanx. Nl external ear canals without cough reflex  NECK :  without JVD/Nodes/TM/ nl carotid upstrokes bilaterally   LUNGS: no acc muscle use, clear to A and P bilaterally with minimal insp and exp rhonchi    CV:  RRR  no s3 or murmur or increase in P2, no edema   ABD:  soft and nontender with nl excursion in the supine position. No bruits or organomegaly, bowel sounds nl  MS:  warm without deformities, calf tenderness, cyanosis or clubbing  SKIN: warm and dry without lesions      CT chest 01/17/16  (low dose screening) Emphysema with innumerable peripheral tiny pulmonary nodules scattered mainly in a peribronchovascular distribution and associated with bronchial wall thickening, bronchiectasis, and airway impaction. These changes are associated with areas of irregular airspace consolidation mainly in the dependent lower lobes bilaterally        Assessment & Plan:

## 2016-04-25 NOTE — Assessment & Plan Note (Addendum)
CT chest 01/17/16  (low dose screening) Emphysema with innumerable peripheral tiny pulmonary nodules scattered mainly in a peribronchovascular distribution and associated with bronchial wall thickening, bronchiectasis, and airway impaction. These changes are associated with areas of irregular airspace consolidation mainly in the dependent lower lobes bilaterally   Not clear to me how much this finding is clinically relevant or contributing to symptoms of chronic cough and purulent sputum refractory to abx > will let him finish up his rx with ENT and f/u here in 6 weeks - if truly refractory symptoms/ purulent sputum next step is FOB/ bal   I had an extended discussion with the patient reviewing all relevant studies completed to date and  lasting 15 to 20 minutes of a 25 minute visit    Each maintenance medication was reviewed in detail including most importantly the difference between maintenance and prns and under what circumstances the prns are to be triggered using an action plan format that is not reflected in the computer generated alphabetically organized AVS.    Please see instructions for details which were reviewed in writing and the patient given a copy highlighting the part that I personally wrote and discussed at today's ov.

## 2016-04-25 NOTE — Assessment & Plan Note (Signed)
-   03/08/2016    90% > try symbicort 80 2bid and return for pfts  - PFT's  04/25/2016  FEV1 1.94 (67 % ) ratio 65   p 4 % improvement from saba p nothing prior to study with DLCO  67/66 % corrects to 76  % for alv volume   - 04/25/2016  After extensive coaching HFA effectiveness =    90% > try bevespi semples  His symptoms are cough > sob and may well be due more to sinusitis than copd/cb but worth trying bevespi empirically next and if not change in symptoms then permanently stop copd rx and just use saba or saba/sama prn

## 2016-04-25 NOTE — Patient Instructions (Addendum)
Bevespi Take 2 puffs first thing in am and then another 2 puffs about 12 hours later  trial >> if cough or breathing better continue if not stop  For cough ok to use mucinex dm up to 1200 mg every 12 hours and the flutter valve as much as possible   Please schedule a follow up office visit in 6 weeks, call sooner if needed  Add: if truly refractory symptoms/ purulent sputum next step is FOB/ bal

## 2016-05-22 DIAGNOSIS — E119 Type 2 diabetes mellitus without complications: Secondary | ICD-10-CM | POA: Diagnosis not present

## 2016-05-22 DIAGNOSIS — H25813 Combined forms of age-related cataract, bilateral: Secondary | ICD-10-CM | POA: Diagnosis not present

## 2016-06-06 DIAGNOSIS — R05 Cough: Secondary | ICD-10-CM | POA: Diagnosis not present

## 2016-06-06 DIAGNOSIS — J324 Chronic pansinusitis: Secondary | ICD-10-CM | POA: Diagnosis not present

## 2016-06-07 ENCOUNTER — Ambulatory Visit (INDEPENDENT_AMBULATORY_CARE_PROVIDER_SITE_OTHER): Payer: Medicare Other | Admitting: Internal Medicine

## 2016-06-07 ENCOUNTER — Encounter: Payer: Self-pay | Admitting: Internal Medicine

## 2016-06-07 VITALS — BP 136/70 | HR 79 | Ht 68.0 in | Wt 178.8 lb

## 2016-06-07 DIAGNOSIS — R918 Other nonspecific abnormal finding of lung field: Secondary | ICD-10-CM | POA: Diagnosis not present

## 2016-06-07 DIAGNOSIS — J449 Chronic obstructive pulmonary disease, unspecified: Secondary | ICD-10-CM | POA: Diagnosis not present

## 2016-06-07 MED ORDER — AMOXICILLIN-POT CLAVULANATE 875-125 MG PO TABS: 1.0000 | ORAL_TABLET | Freq: Two times a day (BID) | ORAL | 0 refills | Status: AC

## 2016-06-07 MED ORDER — FAMOTIDINE 20 MG PO TABS
ORAL_TABLET | ORAL | Status: DC
Start: 2016-06-07 — End: 2018-06-29

## 2016-06-07 NOTE — Assessment & Plan Note (Signed)
-   03/08/2016    90% > try symbicort 80 2bid and return for pfts  - PFT's  04/25/2016  FEV1 1.94 (67 % ) ratio 65   p 4 % improvement from saba p nothing prior to study with DLCO  67/66 % corrects to 76  % for alv volume   - 04/25/2016  After extensive coaching HFA effectiveness =    90% > try bevespi samples> no better so d/c'd  With fev1 nearly 2lpm not surprised he did not note change in ex tol on symb or bevespi nor did then help with his main concern, the cough from assoc bronchiectasis (see separate a/p)  In this setting no need for maint rx for copd

## 2016-06-07 NOTE — Assessment & Plan Note (Signed)
CT chest 01/17/16  (low dose screening) Emphysema with innumerable peripheral tiny pulmonary nodules scattered mainly in a peribronchovascular distribution and associated with bronchial wall thickening, bronchiectasis, and airway impaction. These changes are associated with areas of irregular airspace consolidation mainly in the dependent lower lobes bilaterally - 03/08/2016    90% > try symbicort 80 2bid and return for pfts  - PFT's  04/25/2016  FEV1 1.94 (67 % ) ratio 65   p 4 % improvement from saba p nothing prior to study with DLCO  67/66 % corrects to 76  % for alv volume    - 06/07/2016 augmentin bid x 20 days then consider fob depending on resp  I had an extended discussion with the patient reviewing all relevant studies completed to date and  lasting 15 to 20 minutes of a 25 minute visit on the following ongoing concerns:   1) he says the same mucus is draining from sinus that he coughs up, esp x 2 h each am and cultures by [employer] neg  2) MAI may be present and could also contribute to cough and only way to know is to see if gets better s specific rx   3) therefore to sort out rec 20 d augmentin and if not better this would point to resistant gnr or MAI and warrant bal for cultures > Discussed in detail all the  indications, usual  risks and alternatives  relative to the benefits with patient who agrees to proceed with conservative rx as outlined    Each maintenance medication was reviewed in detail including most importantly the difference between maintenance and as needed and under what circumstances the prns are to be used.  Please see AVS for  customized  Instructions which were reviewed in detail in writing with the patient and a copy provided.

## 2016-06-07 NOTE — Patient Instructions (Addendum)
Augmentin 875 mg take one pill twice daily  X 10 days - take at breakfast and supper with large glass of water.  It would help reduce the usual side effects (diarrhea and yeast infections) if you ate cultured yogurt at lunch.   Continue pepcid 20 mg at bedtime until return   For cough > mucinex dm 1200 mg every 12 hours as needed and flutter as much as possible  Please schedule a follow up office visit in 6 weeks, call sooner if needed

## 2016-06-07 NOTE — Progress Notes (Signed)
Subjective:    Patient ID: [name], male    DOB: 1940-12-23  MRN: [id number]    Brief patient profile:  75 yowm quit smoking 2006 with chronic cough at that point which completely resolved p quit smoking although continued then and now to do wood working with onset new cough while on ACEi Dec 2012   Referred 01/08/2013 to pulmonary clinic by Dr Janace Hoard for cough.      History of Present Illness  01/08/2013 1st pulmonary eval/Shauniece Kwan stopped acei in April 2014 cc cough daily x 1.5 y w/in10 min of stirring and brings up tan mucus maybe a half a cup total  by lunch, seems worse sitting. Some sinus drainage better with allegra.  Does not remember abx or prednisone being used and w/u by Dr Janace Hoard was c/w chronic rinitis and ? gerd but  Not taking meds consistently. rec Prednisone 10 mg take  4 each am x 2 days,   2 each am x 2 days,  1 each am x 2 days and stop  Pantoprazole (protonix) 40 mg   Take 30-60 min before first meal of the day and Pepcid 20 mg one bedtime and chlortrimeton 4mg  at bedtime until return to office -  GERD diet. For cough use delsym or mucinex dm to suppress the urge to cough    01/22/2013 f/u ov/Makayli Bracken re chronic cough Chief Complaint  Patient presents with  . Follow-up    Pt states cough is mcuh improved since his last visit. Still coughing up min light yellow sputum a couple times per day. No new co's.  not limted by sob, most of his mucus prod in ams but not premature awakending. Using netti pot but not nasonex (sometimes use it prn when "nose bad") rec Start back on nasonex twice daily  If flare ok to take Prednisone 10 mg take  4 each am x 2 days,   2 each am x 2 days,  1 each am x 2 days and stop    03/08/2013 f/u ov/Tanijah Morais re chronic cough Chief Complaint  Patient presents with  . 6 Week Follow Up    Cough is still present, unchanged. Reports production of yellow/green mucus. Cough is consistent throughout the day.   did not stay on meds as rec, confused with  names of meds he is on. No noct disturbance at all. Says "zero" response to everything we've done - not even transiently better with prednisone  rec  repeat sinus CT > chronic changes/ no air fluid levels  Stop cozar (losaartan) and start micardis 40 mg sa mples take one daily  Stop nasonex and start dymista one twice daily each nostril  For cough > mucinex dm 1200 mg every 12 hours as needed    04/05/2013 f/u ov/Quanna Wittke re: cough x 2 years Chief Complaint  Patient presents with  . Follow-up    Pt states cough had improved until he developed sinus infection approx 5 days after his last visit. Cough is prod with moderate amount tan/yellow sputum.  He also c/o minoe sore throat.   cough is worse early in am  And also in pm if uses his voice. Still using vit D oil based vit dymista helping nasal drainage better than anything else. Did not maintain on gerd rx  rec augmentin 875 twice daily x 10 days Prednisone 10 mg take  4 each am x 2 days, 2 each am x 2 days,  1 each am x 2 days and stop  Take pepcid 20mg  and chlortrimeton 4 mg at bedtime as long as coughing Continue dymista and add singulair 10 mg daily for at least a month and  stop if not helping your cough or nose  GERD diet  If not satisfied then next step is to return to Dr Janace Hoard    03/08/2016  Consultation/ Karyna Bessler re:  Abnormal CT chest c/w bronchiectasis  Chief Complaint  Patient presents with  . Pulmonary Consult    Referred by Dr. Hulan Fess. Pt last seen Oct 2014. He had sinus surgery in Jan 2017 and his cough has improved some since then. He has occ cough with yellow sputum.    Cough never completely resolved x 2012   is most prod p stirs in am / always yellow even p abx/ one episode of hemotpysis x 3 m prior to OV but this was in setting of epistaxis and ongoing nasal congestion that only improved transiently p sinus surgery rec Bronchiectasis  pathophys Whenever you develop cough congestion take mucinex or mucinex dm 1200 mg  every 12 hours  Symbicort 80 Take 2 puffs first thing in am and then another 2 puffs about 12 hours later.  Work on inhaler technique:  Please schedule a follow up office visit in 6 weeks, call sooner if needed with pfts - ok to push back so we do both same day     04/25/2016  f/u ov/Ashutosh Dieguez re:  copd GOLD II / MPNs with bronchiectasis  Chief Complaint  Patient presents with  . Follow-up    PFT's today. Cough has been about the same. No new co's today.  no better on symbicort / same cough still following up with ent on rx for sinus dz rec Bevespi Take 2 puffs first thing in am and then another 2 puffs about 12 hours later  trial >> if cough or breathing better continue if not stop For cough ok to use mucinex dm up to 1200 mg every 12 hours and the flutter valve as much as possible  Please schedule a follow up office visit in 6 weeks, call sooner if needed  Add: if truly refractory symptoms/ purulent sputum next step is FOB/ bal     06/07/2016  f/u ov/Madix Blowe re: GOLD II copd/bronchiectasis  Chief Complaint  Patient presents with  . Follow-up    Cough has been worse. He is coughing up yellow/tan sputum.    worse first thing in am/ using flutter / last sev hours and also while sleeping / no better with symb or bevespi in terms of cough and . Not limited by breathing from desired activities    No obvious day to day or daytime variability or assoc    cp or chest tightness, subjective wheeze or overt  hb symptoms. No unusual exp hx or h/o childhood pna/ asthma or knowledge of premature birth.  Sleeping ok without nocturnal  or early am exacerbation  of respiratory  c/o's or need for noct saba. Also denies any obvious fluctuation of symptoms with weather or environmental changes or other aggravating or alleviating factors except as outlined above   Current Medications, Allergies, Complete Past Medical History, Past Surgical History, Family History, and Social History were reviewed in [employer] record.  ROS  The following are not active complaints unless bolded sore throat, dysphagia, dental problems, itching, sneezing,  nasal congestion or excess/ purulent secretions, ear ache,   fever, chills, sweats, unintended wt loss, classically pleuritic or exertional cp, hemoptysis,  orthopnea pnd or leg swelling, presyncope, palpitations, abdominal pain, anorexia, nausea, vomiting, diarrhea  or change in bowel or bladder habits, change in stools or urine, dysuria,hematuria,  rash, arthralgias, visual complaints, headache, numbness, weakness or ataxia or problems with walking or coordination,  change in mood/affect or memory.              Objective:   Physical Exam   amb wm    Vital signs reviewed   -  - Note on arrival 02 sats  97% on RA     03/08/2013       200 > 04/05/2013   194 >  03/08/2016  179  > 04/25/2016  178 > 06/07/2016  178     01/22/13 199 lb 12.8 oz (90.629 kg)  01/08/13 197 lb (89.359 kg)       HEENT: nl dentition,  and orophanx. Nl external ear canals without cough reflex- moderate bilateral non-specific turbinate edema     NECK :  without JVD/Nodes/TM/ nl carotid upstrokes bilaterally   LUNGS: no acc muscle use,   minimal insp and exp rhonchi    CV:  RRR  no s3 or murmur or increase in P2, no edema   ABD:  soft and nontender with nl excursion in the supine position. No bruits or organomegaly, bowel sounds nl  MS:  warm without deformities, calf tenderness, cyanosis or clubbing  SKIN: warm and dry without lesions      CT chest 01/17/16  (low dose screening) Emphysema with innumerable peripheral tiny pulmonary nodules scattered mainly in a peribronchovascular distribution and associated with bronchial wall thickening, bronchiectasis, and airway impaction. These changes are associated with areas of irregular airspace consolidation mainly in the dependent lower lobes bilaterally        Assessment & Plan:

## 2016-06-08 ENCOUNTER — Encounter: Payer: Self-pay | Admitting: Acute Care

## 2016-07-19 ENCOUNTER — Ambulatory Visit (INDEPENDENT_AMBULATORY_CARE_PROVIDER_SITE_OTHER): Payer: Medicare Other | Admitting: Internal Medicine

## 2016-07-19 ENCOUNTER — Encounter: Payer: Self-pay | Admitting: Internal Medicine

## 2016-07-19 ENCOUNTER — Other Ambulatory Visit (INDEPENDENT_AMBULATORY_CARE_PROVIDER_SITE_OTHER): Payer: Medicare Other

## 2016-07-19 VITALS — BP 138/68 | HR 78 | Ht 68.0 in | Wt 184.0 lb

## 2016-07-19 DIAGNOSIS — R059 Cough, unspecified: Secondary | ICD-10-CM

## 2016-07-19 DIAGNOSIS — R05 Cough: Secondary | ICD-10-CM | POA: Diagnosis not present

## 2016-07-19 DIAGNOSIS — J449 Chronic obstructive pulmonary disease, unspecified: Secondary | ICD-10-CM | POA: Diagnosis not present

## 2016-07-19 DIAGNOSIS — R918 Other nonspecific abnormal finding of lung field: Secondary | ICD-10-CM

## 2016-07-19 LAB — CBC WITH DIFFERENTIAL/PLATELET
Basophils Absolute: 0 10*3/uL (ref 0.0–0.1)
Basophils Relative: 0.4 % (ref 0.0–3.0)
EOS PCT: 1.8 % (ref 0.0–5.0)
Eosinophils Absolute: 0.2 10*3/uL (ref 0.0–0.7)
HCT: 44.4 % (ref 39.0–52.0)
HEMOGLOBIN: 15.1 g/dL (ref 13.0–17.0)
LYMPHS PCT: 24.4 % (ref 12.0–46.0)
Lymphs Abs: 2.6 10*3/uL (ref 0.7–4.0)
MCHC: 34 g/dL (ref 30.0–36.0)
MCV: 90.7 fl (ref 78.0–100.0)
Monocytes Absolute: 1.1 10*3/uL — ABNORMAL HIGH (ref 0.1–1.0)
Monocytes Relative: 9.9 % (ref 3.0–12.0)
Neutro Abs: 6.8 10*3/uL (ref 1.4–7.7)
Neutrophils Relative %: 63.5 % (ref 43.0–77.0)
Platelets: 371 10*3/uL (ref 150.0–400.0)
RBC: 4.9 Mil/uL (ref 4.22–5.81)
RDW: 13.1 % (ref 11.5–15.5)
WBC: 10.7 10*3/uL — AB (ref 4.0–10.5)

## 2016-07-19 LAB — SEDIMENTATION RATE: Sed Rate: 20 mm/hr (ref 0–20)

## 2016-07-19 MED ORDER — PANTOPRAZOLE SODIUM 40 MG PO TBEC: 40.0000 mg | DELAYED_RELEASE_TABLET | Freq: Every day | ORAL | 2 refills | Status: DC

## 2016-07-19 NOTE — Progress Notes (Signed)
Subjective:    Patient ID: [name], male    DOB: Mar 07, 1941  MRN: [id number]    Brief patient profile:  75 yowm quit smoking 2006 with chronic cough at that point which completely resolved p quit smoking although continued then and now to do wood working with onset new cough while on ACEi Dec 2012   Referred 01/08/2013 to pulmonary clinic by Dr Janace Hoard for cough.    History of Present Illness  01/08/2013 1st pulmonary eval/Lido Maske stopped acei in April 2014 cc cough daily x 1.5 y w/in10 min of stirring and brings up tan mucus maybe a half a cup total  by lunch, seems worse sitting. Some sinus drainage better with allegra.  Does not remember abx or prednisone being used and w/u by Dr Janace Hoard was c/w chronic rinitis and ? gerd but  Not taking meds consistently. rec Prednisone 10 mg take  4 each am x 2 days,   2 each am x 2 days,  1 each am x 2 days and stop  Pantoprazole (protonix) 40 mg   Take 30-60 min before first meal of the day and Pepcid 20 mg one bedtime and chlortrimeton 70m at bedtime until return to office -  GERD diet. For cough use delsym or mucinex dm to suppress the urge to cough    01/22/2013 f/u ov/Captain Blucher re chronic cough Chief Complaint  Patient presents with  . Follow-up    Pt states cough is mcuh improved since his last visit. Still coughing up min light yellow sputum a couple times per day. No new co's.  not limited by sob, most of his mucus prod in ams but not premature awakending. Using netti pot but not nasonex (sometimes use it prn when "nose bad") rec Start back on nasonex twice daily  If flare ok to take Prednisone 10 mg take  4 each am x 2 days,   2 each am x 2 days,  1 each am x 2 days and stop    03/08/2013 f/u ov/Reise Gladney re chronic cough Chief Complaint  Patient presents with  . 6 Week Follow Up    Cough is still present, unchanged. Reports production of yellow/green mucus. Cough is consistent throughout the day.   did not stay on meds as rec, confused with names  of meds he is on. No noct disturbance at all. Says "zero" response to everything we've done - not even transiently better with prednisone  rec  repeat sinus CT > chronic changes/ no air fluid levels  Stop cozar (losaartan) and start micardis 40 mg samples take one daily  Stop nasonex and start dymista one twice daily each nostril  For cough > mucinex dm 1200 mg every 12 hours as needed    04/05/2013 f/u ov/Devonna Oboyle re: cough x 2 years Chief Complaint  Patient presents with  . Follow-up    Pt states cough had improved until he developed sinus infection approx 5 days after his last visit. Cough is prod with moderate amount tan/yellow sputum.  He also c/o minoe sore throat.   cough is worse early in am  And also in pm if uses his voice. Still using vit D oil based vit dymista helping nasal drainage better than anything else. Did not maintain on gerd rx  rec augmentin 875 twice daily x 10 days Prednisone 10 mg take  4 each am x 2 days, 2 each am x 2 days,  1 each am x 2 days and stop  Take pepcid  40m and chlortrimeton 4 mg at bedtime as long as coughing Continue dymista and add singulair 10 mg daily for at least a month and  stop if not helping your cough or nose  GERD diet  If not satisfied then next step is to return to Dr BKarrie Doffing   03/08/2016  Consultation/ Geisha Abernathy re:  Abnormal CT chest c/w bronchiectasis  Chief Complaint  Patient presents with  . Pulmonary Consult    Referred by Dr. KHulan Fess Pt last seen Oct 2014. He had sinus surgery in Jan 2017 and his cough has improved some since then. He has occ cough with yellow sputum.    Cough never completely resolved x 2012   is most prod p stirs in am / always yellow even p abx/ one episode of hemotpysis x 3 m prior to OV but this was in setting of epistaxis and ongoing nasal congestion that only improved transiently p sinus surgery rec Bronchiectasis  pathophys Whenever you develop cough congestion take mucinex or mucinex dm 1200 mg  every 12 hours  Symbicort 80 Take 2 puffs first thing in am and then another 2 puffs about 12 hours later.  Work on inhaler technique:  Please schedule a follow up office visit in 6 weeks, call sooner if needed with pfts - ok to push back so we do both same day     04/25/2016  f/u ov/Johonna Binette re:  COPD GOLD II / MPNs with bronchiectasis  Chief Complaint  Patient presents with  . Follow-up    PFT's today. Cough has been about the same. No new co's today.  no better on symbicort / same cough still following up with ent on rx for sinus dz rec Bevespi Take 2 puffs first thing in am and then another 2 puffs about 12 hours later  trial >> if cough or breathing better continue if not stop For cough ok to use mucinex dm up to 1200 mg every 12 hours and the flutter valve as much as possible  Add: if truly refractory symptoms/ purulent sputum next step is FOB/ bal     06/07/2016  f/u ov/Tamel Abel re: GOLD II copd/bronchiectasis /sinusitis  Chief Complaint  Patient presents with  . Follow-up    Cough has been worse. He is coughing up yellow/tan sputum.    worse first thing in am/ using flutter / cough lasts sev hours and also while sleeping / no better with symb or bevespi in terms of cough but . Not limited by breathing from desired activities   rec Augmentin 875 mg take one pill twice daily  X 20 days - take at breakfast and supper with large glass of water.  It would help reduce the usual side effects (diarrhea and yeast infections) if you ate cultured yogurt at lunch.  Continue pepcid 20 mg at bedtime until return  for cough > mucinex dm 1200 mg every 12 hours as needed and flutter as much as possible    07/19/2016  f/u ov/Shavontae Gibeault re:  GOLD II/ bronchiectasis/sinusitis Chief Complaint  Patient presents with  . Follow-up    Cough is unchanged. He states having some PND.    no better p 20 days augmentin with cough For several hours each morning and also when he lies down at night and no change on  versus off inhalers including Bevespi and Symbicort.  No obvious day to day or daytime variability or assoc  Mucus plugs or hemoptysis  cp or chest tightness, subjective wheeze or  overt  hb symptoms. No unusual exp hx or h/o childhood pna/ asthma or knowledge of premature birth.  Sleeping ok without nocturnal  or early am exacerbation  of respiratory  c/o's or need for noct saba. Also denies any obvious fluctuation of symptoms with weather or environmental changes or other aggravating or alleviating factors except as outlined above   Current Medications, Allergies, Complete Past Medical History, Past Surgical History, Family History, and Social History were reviewed in [employer] record.  ROS  The following are not active complaints unless bolded sore throat, dysphagia, dental problems, itching, sneezing,  nasal congestion or excess/ purulent secretions, ear ache,   fever, chills, sweats, unintended wt loss, classically pleuritic or exertional cp, hemoptysis,  orthopnea pnd or leg swelling, presyncope, palpitations, abdominal pain, anorexia, nausea, vomiting, diarrhea  or change in bowel or bladder habits, change in stools or urine, dysuria,hematuria,  rash, arthralgias, visual complaints, headache, numbness, weakness or ataxia or problems with walking or coordination,  change in mood/affect or memory.              Objective:   Physical Exam   amb wm  nad   Vital signs reviewed   -  - Note on arrival 02 sats  93% on RA     03/08/2013       200 > 04/05/2013   194 >  03/08/2016  179  > 04/25/2016  178 > 06/07/2016  178     01/22/13 199 lb 12.8 oz (90.629 kg)  01/08/13 197 lb (89.359 kg)       HEENT: nl dentition,  and orophanx. Nl external ear canals without cough reflex- moderate bilateral non-specific turbinate edema     NECK :  without JVD/Nodes/TM/ nl carotid upstrokes bilaterally   LUNGS: no acc muscle use,   minimal insp and exp rhonchi    CV:  RRR   no s3 or murmur or increase in P2, no edema   ABD:  soft and nontender with nl excursion in the supine position. No bruits or organomegaly, bowel sounds nl  MS:  warm without deformities, calf tenderness, cyanosis or clubbing  SKIN: warm and dry without lesions        CT chest 01/17/16  (low dose screening) Emphysema with innumerable peripheral tiny pulmonary nodules scattered mainly in a peribronchovascular distribution and associated with bronchial wall thickening, bronchiectasis, and airway impaction. These changes are associated with areas of irregular airspace consolidation mainly in the dependent lower lobes bilaterally        Assessment & Plan:

## 2016-07-19 NOTE — Patient Instructions (Signed)
Prednisone 10 mg take  4 each am x 2 days,   2 each am x 2 days,  1 each am x 2 days and stop   Pantoprazole (protonix) 40 mg   Take  30-60 min before first meal of the day and Pepcid (famotidine)  20 mg one @  bedtime until return to office - this is the best way to tell whether stomach acid is contributing to your problem.    GERD (REFLUX)  is an extremely common cause of respiratory symptoms just like yours , many times with no obvious heartburn at all.    It can be treated with medication, but also with lifestyle changes including elevation of the head of your bed (ideally with 6 inch  bed blocks),  Smoking cessation, avoidance of late meals, excessive alcohol, and avoid fatty foods, chocolate, peppermint, colas, red wine, and acidic juices such as orange juice.  NO MINT OR MENTHOL PRODUCTS SO NO COUGH DROPS  USE SUGARLESS CANDY INSTEAD (Jolley ranchers or Stover's or Life Savers) or even ice chips will also do - the key is to swallow to prevent all throat clearing. NO OIL BASED VITAMINS - use powdered substitutes.    If not better call [facility] at [phone] for sinus CT and if that is negative then bronchoscopy

## 2016-07-20 NOTE — Assessment & Plan Note (Signed)
-   Sinus CT 03/08/2013 > chronic changes without a/f levels - Allergy Profile 03/08/2013 > no Eos, IgE 5 - singulair trial 04/05/2013 > no response - CT Chest 01/17/16 > Bronchiectasis > see sep a/p     Reminded him regarding the use of Xanax DM and flutter valve and continuing maximum treatment for reflux. I did give him a short course of prednisone as well for any inflammatory component that may be present.

## 2016-07-20 NOTE — Assessment & Plan Note (Signed)
-   03/08/2016    90% > try symbicort 80 2bid and return for pfts  - PFT's  04/25/2016  FEV1 1.94 (67 % ) ratio 65   p 4 % improvement from saba p nothing prior to study with DLCO  67/66 % corrects to 76  % for alv volume   - 04/25/2016  After extensive coaching HFA effectiveness =    90% > try bevespi samples> no better so d/c'd  His cough appears to be way out of proportion to his degree of airflow obstruction which is really not limiting him and did not improve either on Symbicort or Bevespi so it is fine to leave  both these off for now.

## 2016-07-20 NOTE — Assessment & Plan Note (Signed)
CT chest 01/17/16  (low dose screening) Emphysema with innumerable peripheral tiny pulmonary nodules scattered mainly in a peribronchovascular distribution and associated with bronchial wall thickening, bronchiectasis, and airway impaction. These changes are associated with areas of irregular airspace consolidation mainly in the dependent lower lobes bilaterally - 03/08/2016    90% > try symbicort 80 2bid and return for pfts  - PFT's  04/25/2016  FEV1 1.94 (67 % ) ratio 65   p 4 % improvement from saba p nothing prior to study with DLCO  67/66 % corrects to 76  % for alv volume   - 06/07/2016 augmentin bid x 20 days> no better as of 07/19/2016   He is convinced his problem is due to nasal discharge and drainage and therefore it is reasonable to repeat a sinus CT scan and send him back to Dr. Redmond Baseman if indeed there are significant findings. Otherwise I strongly recommend we proceed to bronchoscopy for the purpose of trying to identify any resistant organisms but especially MAI.  Discussed in detail all the  indications, usual  risks and alternatives  relative to the benefits with patient who agrees to proceed with conservative f/u as outlined    I had an extended discussion with the patient reviewing all relevant studies completed to date and  lasting 15 to 20 minutes of a 25 minute visit    Each maintenance medication was reviewed in detail including most importantly the difference between maintenance and prns and under what circumstances the prns are to be triggered using an action plan format that is not reflected in the computer generated alphabetically organized AVS.    Please see AVS for specific instructions unique to this visit that I personally wrote and verbalized to the the pt in detail and then reviewed with pt  by my nurse highlighting any  changes in therapy recommended at today's visit to their plan of care.

## 2016-07-22 ENCOUNTER — Telehealth: Payer: Self-pay | Admitting: Internal Medicine

## 2016-07-22 LAB — RESPIRATORY ALLERGY PROFILE REGION II ~~LOC~~
Allergen, Cedar tree, t12: <0.1 kU/L
Allergen, Comm Silver Birch, t9: <0.1 kU/L
Allergen, Cottonwood, t14: <0.1 kU/L
Allergen, Mouse Urine Protein, e78: <0.1 kU/L
Aspergillus fumigatus, m3: <0.1 kU/L
Bermuda Grass: <0.1 kU/L
Cat Dander: <0.1 kU/L
Common Ragweed: <0.1 kU/L
Dog Dander: <0.1 kU/L
Elm IgE: <0.1 kU/L
IgE (Immunoglobulin E), Serum: 6 kU/L (ref <115)
Pecan/Hickory Tree IgE: <0.1 kU/L
Timothy Grass: <0.1 kU/L

## 2016-07-22 LAB — ALPHA-1-ANTITRYPSIN: A-1 Antitrypsin, Ser: 188 mg/dL (ref 83–199)

## 2016-07-22 MED ORDER — PREDNISONE 10 MG PO TABS: ORAL_TABLET | ORAL | 0 refills | Status: DC

## 2016-07-22 NOTE — Telephone Encounter (Signed)
Patient is leaving to go out of town tomorrow am and needs this sent over as soon as possible.

## 2016-07-22 NOTE — Telephone Encounter (Signed)
Spoke with pt. The rx was sent to his pharmacy of choice. Nothing further is needed at this time.

## 2016-07-25 LAB — ALPHA-1 ANTITRYPSIN PHENOTYPE: A-1 Antitrypsin: 208 mg/dL — ABNORMAL HIGH (ref 83–199)

## 2016-08-15 ENCOUNTER — Ambulatory Visit (INDEPENDENT_AMBULATORY_CARE_PROVIDER_SITE_OTHER)
Admission: RE | Admit: 2016-08-15 | Discharge: 2016-08-15 | Disposition: A | Discharge status: Home / Self Care | Payer: Medicare Other | Source: Ambulatory Visit | Attending: Acute Care | Admitting: Acute Care

## 2016-08-15 DIAGNOSIS — Z87891 Personal history of nicotine dependence: Secondary | ICD-10-CM

## 2016-08-15 DIAGNOSIS — R918 Other nonspecific abnormal finding of lung field: Secondary | ICD-10-CM

## 2016-08-22 ENCOUNTER — Telehealth: Payer: Self-pay | Admitting: Internal Medicine

## 2016-08-23 NOTE — Telephone Encounter (Signed)
Needs ov with me next available - should bring all meds

## 2016-08-23 NOTE — Telephone Encounter (Signed)
Pt stated that dr wert told him to call me if not better however he did the low dose chest ct last wk and when my chart sent him the results he wants to talk to dr wert about this 1st then talk about doing the sinus ct later please have de wert call him

## 2016-08-23 NOTE — Telephone Encounter (Signed)
lmtcb x1 for pt. 

## 2016-08-23 NOTE — Telephone Encounter (Signed)
Patient returned call - scheduled appt with MW on 08/28/16 @ 1:45pm -pr

## 2016-08-28 ENCOUNTER — Ambulatory Visit (INDEPENDENT_AMBULATORY_CARE_PROVIDER_SITE_OTHER): Payer: Medicare Other | Admitting: Internal Medicine

## 2016-08-28 ENCOUNTER — Encounter: Payer: Self-pay | Admitting: Internal Medicine

## 2016-08-28 VITALS — BP 120/60 | HR 65 | Ht 68.0 in | Wt 178.0 lb

## 2016-08-28 DIAGNOSIS — R918 Other nonspecific abnormal finding of lung field: Secondary | ICD-10-CM

## 2016-08-28 DIAGNOSIS — R05 Cough: Secondary | ICD-10-CM

## 2016-08-28 DIAGNOSIS — J449 Chronic obstructive pulmonary disease, unspecified: Secondary | ICD-10-CM | POA: Diagnosis not present

## 2016-08-28 DIAGNOSIS — R059 Cough, unspecified: Secondary | ICD-10-CM

## 2016-08-28 NOTE — Progress Notes (Signed)
Subjective:    Patient ID: [name], male    DOB: 1940/09/10  MRN: [id number]    Brief patient profile:  46 yowm quit smoking 2006 with chronic cough at that point which completely resolved p quit smoking although continued then and now to do wood working with onset new cough while on ACEi Dec 2012   Referred 01/08/2013 to pulmonary clinic by Dr Janace Hoard for cough.    History of Present Illness  01/08/2013 1st pulmonary eval/Keyana Guevara stopped acei in April 2014 cc cough daily x 1.5 y w/in10 min of stirring and brings up tan mucus maybe a half a cup total  by lunch, seems worse sitting. Some sinus drainage better with allegra.  Does not remember abx or prednisone being used and w/u by Dr Janace Hoard was c/w chronic rinitis and ? gerd but  Not taking meds consistently. rec Prednisone 10 mg take  4 each am x 2 days,   2 each am x 2 days,  1 each am x 2 days and stop  Pantoprazole (protonix) 40 mg   Take 30-60 min before first meal of the day and Pepcid 20 mg one bedtime and chlortrimeton 65m at bedtime until return to office -  GERD diet. For cough use delsym or mucinex dm to suppress the urge to cough    01/22/2013 f/u ov/Zarian Colpitts re chronic cough Chief Complaint  Patient presents with  . Follow-up    Pt states cough is mcuh improved since his last visit. Still coughing up min light yellow sputum a couple times per day. No new co's.  not limited by sob, most of his mucus prod in ams but not premature awakending. Using netti pot but not nasonex (sometimes use it prn when "nose bad") rec Start back on nasonex twice daily  If flare ok to take Prednisone 10 mg take  4 each am x 2 days,   2 each am x 2 days,  1 each am x 2 days and stop    03/08/2013 f/u ov/Allexus Ovens re chronic cough Chief Complaint  Patient presents with  . 6 Week Follow Up    Cough is still present, unchanged. Reports production of yellow/green mucus. Cough is consistent throughout the day.   did not stay on meds as rec, confused with names  of meds he is on. No noct disturbance at all. Says "zero" response to everything we've done - not even transiently better with prednisone  rec  repeat sinus CT > chronic changes/ no air fluid levels  Stop cozar (losaartan) and start micardis 40 mg samples take one daily  Stop nasonex and start dymista one twice daily each nostril  For cough > mucinex dm 1200 mg every 12 hours as needed    04/05/2013 f/u ov/Elzie Sheets re: cough x 2 years Chief Complaint  Patient presents with  . Follow-up    Pt states cough had improved until he developed sinus infection approx 5 days after his last visit. Cough is prod with moderate amount tan/yellow sputum.  He also c/o minoe sore throat.   cough is worse early in am  And also in pm if uses his voice. Still using vit D oil based vit dymista helping nasal drainage better than anything else. Did not maintain on gerd rx  rec augmentin 875 twice daily x 10 days Prednisone 10 mg take  4 each am x 2 days, 2 each am x 2 days,  1 each am x 2 days and stop  Take pepcid  64m and chlortrimeton 4 mg at bedtime as long as coughing Continue dymista and add singulair 10 mg daily for at least a month and  stop if not helping your cough or nose  GERD diet  If not satisfied then next step is to return to Dr BKarrie Doffing   03/08/2016  Consultation/ Breelle Hollywood re:  Abnormal CT chest c/w bronchiectasis  Chief Complaint  Patient presents with  . Pulmonary Consult    Referred by Dr. KHulan Fess Pt last seen Oct 2014. He had sinus surgery in Jan 2017 and his cough has improved some since then. He has occ cough with yellow sputum.    Cough never completely resolved x 2012   is most prod p stirs in am / always yellow even p abx/ one episode of hemotpysis x 3 m prior to OV but this was in setting of epistaxis and ongoing nasal congestion that only improved transiently p sinus surgery rec Bronchiectasis  Pathophysiology  Whenever you develop cough congestion take mucinex or mucinex dm  1200 mg every 12 hours  Symbicort 80 Take 2 puffs first thing in am and then another 2 puffs about 12 hours later.  Work on inhaler technique:  Please schedule a follow up office visit in 6 weeks, call sooner if needed with pfts - ok to push back so we do both same day     04/25/2016  f/u ov/Lenette Rau re:  COPD GOLD II / MPNs with bronchiectasis  Chief Complaint  Patient presents with  . Follow-up    PFT's today. Cough has been about the same. No new co's today.  no better on symbicort / same cough still following up with ent on rx for sinus dz rec Bevespi Take 2 puffs first thing in am and then another 2 puffs about 12 hours later  trial >> if cough or breathing better continue if not stop For cough ok to use mucinex dm up to 1200 mg every 12 hours and the flutter valve as much as possible  Add: if truly refractory symptoms/ purulent sputum next step is FOB/ bal     06/07/2016  f/u ov/Bhavana Kady re: GOLD II copd/bronchiectasis /sinusitis  Chief Complaint  Patient presents with  . Follow-up    Cough has been worse. He is coughing up yellow/tan sputum.    worse first thing in am/ using flutter / cough lasts sev hours and also while sleeping / no better with symb or bevespi in terms of cough but . Not limited by breathing from desired activities   rec Augmentin 875 mg take one pill twice daily  X 20 days - take at breakfast and supper with large glass of water.  It would help reduce the usual side effects (diarrhea and yeast infections) if you ate cultured yogurt at lunch.  Continue pepcid 20 mg at bedtime until return  for cough > mucinex dm 1200 mg every 12 hours as needed and flutter as much as possible    07/19/2016  f/u ov/Terel Bann re:  GOLD II/ bronchiectasis/sinusitis Chief Complaint  Patient presents with  . Follow-up    Cough is unchanged. He states having some PND.    no better p 20 days augmentin with cough For several hours each morning and also when he lies down at night and no change  on versus off inhalers including Bevespi and Symbicort. rec Prednisone 10 mg take  4 each am x 2 days,   2 each am x 2 days,  1 each  am x 2 days and stop  Pantoprazole (protonix) 40 mg   Take  30-60 min before first meal of the day and Pepcid (famotidine)  20 mg one @  bedtime until return to office  GERD If not better call [facility] at [phone] for sinus CT and if that is negative then bronchoscopy    08/28/2016  f/u ov/Laure Leone re:  GOLD II /bronchiectasis and sinusitis and no inhalers  Chief Complaint  Patient presents with  . Follow-up    Cough has improved some, but not resolved. He just finished amox due to just having dental work. His breathing is unchanged.   worst cough is in am x 15 min clearly better while on abx vs off, no better on pred on inhalers    No obvious day to day or daytime variability or assoc  Mucus plugs or hemoptysis  cp or chest tightness, subjective wheeze or overt  hb symptoms. No unusual exp hx or h/o childhood pna/ asthma or knowledge of premature birth.  Sleeping ok without nocturnal  or early am exacerbation  of respiratory  c/o's or need for noct saba. Also denies any obvious fluctuation of symptoms with weather or environmental changes or other aggravating or alleviating factors except as outlined above   Current Medications, Allergies, Complete Past Medical History, Past Surgical History, Family History, and Social History were reviewed in [employer] record.  ROS  The following are not active complaints unless bolded sore throat, dysphagia, dental problems, itching, sneezing,  nasal congestion or excess/ purulent secretions, ear ache,   fever, chills, sweats, unintended wt loss, classically pleuritic or exertional cp, hemoptysis,  orthopnea pnd or leg swelling, presyncope, palpitations, abdominal pain, anorexia, nausea, vomiting, diarrhea  or change in bowel or bladder habits, change in stools or urine, dysuria,hematuria,  rash,  arthralgias, visual complaints, headache, numbness, weakness or ataxia or problems with walking or coordination,  change in mood/affect or memory.              Objective:   Physical Exam   amb wm  nad   Vital signs reviewed     - Note on arrival 02 sats  94% on RA     03/08/2013       200 > 04/05/2013   194 >  03/08/2016  179  > 04/25/2016  178 > 06/07/2016  178 > 08/28/2016  178     01/22/13 199 lb 12.8 oz (90.629 kg)  01/08/13 197 lb (89.359 kg)       HEENT: nl dentition,  and oropharynx. Nl external ear canals without cough reflex- moderate bilateral non-specific turbinate edema     NECK :  without JVD/Nodes/TM/ nl carotid upstrokes bilaterally   LUNGS: no acc muscle use,   minimal insp and exp rhonchi bilaterally   CV:  RRR  no s3 or murmur or increase in P2, no edema   ABD:  soft and nontender with nl excursion in the supine position. No bruits or organomegaly, bowel sounds nl  MS:  warm without deformities, calf tenderness, cyanosis or clubbing  SKIN: warm and dry without lesions        CT chest 2./22/18  (low dose screening) Compared to the prior study there has been a dramatic increased in extensive peribronchovascular nodularity throughout the mid to lower lungs bilaterally. This is technically considered Lung-RADS Category 4BS, suspicious, based off of size alone, however, the overall spectrum of findings is strongly favored to be infectious.  Assessment & Plan:

## 2016-08-28 NOTE — Patient Instructions (Addendum)
Please see patient coordinator before you leave today  to schedule sinus CT   For cough mucinex dm up to 1200 mg every 12hours with the flutter valve    Please schedule a follow up office visit in 6 weeks, call sooner if needed with all medications /inhalers/ solutions in hand so we can verify exactly what you are taking. This includes all medications from all doctors and over the counters

## 2016-08-29 NOTE — Assessment & Plan Note (Signed)
-   Sinus CT 03/08/2013 > chronic changes without a/f levels - Allergy Profile 03/08/2013 > no Eos, IgE 5 - singulair trial 04/05/2013 > no response - CT Chest 01/17/16 > Bronchiectasis > see sep a/p  - Allergy profile  07/19/16  >  Eos 0.2/  IgE 6 neg RAST   - Sinus CT 08/28/2016  Ordered

## 2016-08-29 NOTE — Assessment & Plan Note (Signed)
CT chest 01/17/16  (low dose screening) Emphysema with innumerable peripheral tiny pulmonary nodules scattered mainly in a peribronchovascular distribution and associated with bronchial wall thickening, bronchiectasis, and airway impaction. These changes are associated with areas of irregular airspace consolidation mainly in the dependent lower lobes bilaterally - 03/08/2016    90% > try symbicort 80 2bid and return for pfts  - PFT's  04/25/2016  FEV1 1.94 (67 % ) ratio 65   p 4 % improvement from saba p nothing prior to study with DLCO  67/66 % corrects to 76  % for alv volume   - 06/07/2016 augmentin bid x 20 days> no better as of 07/19/2016  - Allergy profile  07/19/16  >  Eos 0.2/  IgE 6 neg RAST  - Alpha one Screen 07/19/16 m>  MM - CT 08/15/16 low dose  Compared to the prior study there has been a dramatic increased in extensive peribronchovascular nodularity throughout the mid to lower lungs bilaterally. This is technically considered Lung-RADS Category 4BS, suspicious, based off of size alone, however, the overall spectrum of findings is strongly favored to be infectious.   Will need fob next but first would like to repeat the sinus ct as I suspect sinus dz is driving most of his pulmoary symptoms if not directly related to ct findings which are more typical of MAI   I had an extended discussion with the patient reviewing all relevant studies completed to date and  lasting 15 to 20 minutes of a 25 minute visit    Each maintenance medication was reviewed in detail including most importantly the difference between maintenance and prns and under what circumstances the prns are to be triggered using an action plan format that is not reflected in the computer generated alphabetically organized AVS.    Please see AVS for specific instructions unique to this visit that I personally wrote and verbalized to the the pt in detail and then reviewed with pt  by my nurse highlighting any  changes in  therapy recommended at today's visit to their plan of care.

## 2016-08-29 NOTE — Assessment & Plan Note (Addendum)
-   03/08/2016    90% > try symbicort 80 2bid and return for pfts  - PFT's  04/25/2016  FEV1 1.94 (67 % ) ratio 65   p 4 % improvement from saba p nothing prior to study with DLCO  67/66 % corrects to 76  % for alv volume   - 04/25/2016  After extensive coaching HFA effectiveness =    90% > try bevespi samples> no better so d/c'd - Alpha one AT 07/19/16 >  MM - Allergy profile 07/19/16  >  Eos 0.2 /  IgE 6 with neg RAST   W/u complete, not interested in inhalers as has tried them all and I suspect this is more of an obstructive bronchiectasis pattern than typical copd so will focus on that for now (see separate a/p)

## 2016-09-03 ENCOUNTER — Telehealth: Payer: Self-pay | Admitting: Acute Care

## 2016-09-03 ENCOUNTER — Other Ambulatory Visit: Payer: Self-pay | Admitting: Acute Care

## 2016-09-03 DIAGNOSIS — Z87891 Personal history of nicotine dependence: Secondary | ICD-10-CM

## 2016-09-03 NOTE — Telephone Encounter (Signed)
Spoke with pt, requesting ct chest results.  Pt states he can see impression on MyChart but is unsure of what it means.  Pt requesting an interpretation of results.    SG please advise on ct chest results.  Thanks!

## 2016-09-03 NOTE — Telephone Encounter (Signed)
I have called Mr. Reier with the results of his scan. I explained that his scan was read as a Lung RADS 4 B indicates suspicious findings .I explained that the radiologist gave the scan a 4B reading based on size alone, but that he favored that this was more likely reflective of an infectious process.The patient has been on a 14 day antibiotic regimen since the scan as prophylaxis for dental surgery. Sputum is clear per the patient. I have reviewed the scan results with Dr. Melvyn Novas, the patient's primary pulmonologist.He feels this is benign and opted to repeat the scan in 3 months. I have explained this to Mr. Steidle, and he is comfortable waiting 3 months to repeat the scan. I will place the order for a 10/2016 scan and fax the results to the patient's PCP. Mr. Clouse verbalized understanding of the above and had no further questions.  Of Note, I asked Mr. Galea about weight loss. He states he has had intentional weight loss that it is due to a serious diet. He has eliminated carbs from his daily intake.

## 2016-09-04 ENCOUNTER — Ambulatory Visit (INDEPENDENT_AMBULATORY_CARE_PROVIDER_SITE_OTHER)
Admission: RE | Admit: 2016-09-04 | Discharge: 2016-09-04 | Disposition: A | Discharge status: Home / Self Care | Payer: Medicare Other | Source: Ambulatory Visit | Attending: Internal Medicine | Admitting: Internal Medicine

## 2016-09-04 DIAGNOSIS — R05 Cough: Secondary | ICD-10-CM

## 2016-09-04 DIAGNOSIS — R059 Cough, unspecified: Secondary | ICD-10-CM

## 2016-09-04 NOTE — Progress Notes (Signed)
Called ans left detailed msg on machine

## 2016-10-09 ENCOUNTER — Encounter: Payer: Self-pay | Admitting: Internal Medicine

## 2016-10-09 ENCOUNTER — Ambulatory Visit (INDEPENDENT_AMBULATORY_CARE_PROVIDER_SITE_OTHER): Payer: Medicare Other | Admitting: Internal Medicine

## 2016-10-09 VITALS — BP 116/74 | HR 57 | Ht 68.0 in | Wt 174.0 lb

## 2016-10-09 DIAGNOSIS — R918 Other nonspecific abnormal finding of lung field: Secondary | ICD-10-CM | POA: Diagnosis not present

## 2016-10-09 DIAGNOSIS — J449 Chronic obstructive pulmonary disease, unspecified: Secondary | ICD-10-CM

## 2016-10-09 MED ORDER — BUDESONIDE-FORMOTEROL FUMARATE 80-4.5 MCG/ACT IN AERO: 2.0000 | INHALATION_SPRAY | Freq: Two times a day (BID) | RESPIRATORY_TRACT | 3 refills | Status: DC

## 2016-10-09 MED ORDER — BUDESONIDE-FORMOTEROL FUMARATE 160-4.5 MCG/ACT IN AERO: INHALATION_SPRAY | RESPIRATORY_TRACT | 11 refills | Status: DC

## 2016-10-09 MED ORDER — BUDESONIDE-FORMOTEROL FUMARATE 80-4.5 MCG/ACT IN AERO: 2.0000 | INHALATION_SPRAY | Freq: Two times a day (BID) | RESPIRATORY_TRACT | 0 refills | Status: DC

## 2016-10-09 NOTE — Progress Notes (Signed)
Subjective:    Patient ID: [name], male    DOB: May 15, 1941  MRN: [id number]    Brief patient profile:  78 yowm quit smoking 2006 with chronic cough at that point which completely resolved p quit smoking although continued then and now to do wood working with onset new cough while on ACEi Dec 2012   Referred 01/08/2013 to pulmonary clinic by Dr Janace Hoard for cough.    History of Present Illness  01/08/2013 1st pulmonary eval/[name] stopped acei in April 2014 cc cough daily x 1.5 y w/in10 min of stirring and brings up tan mucus maybe a half a cup total  by lunch, seems worse sitting. Some sinus drainage better with allegra.  Does not remember abx or prednisone being used and w/u by Dr Janace Hoard was c/w chronic rinitis and ? gerd but  Not taking meds consistently. rec Prednisone 10 mg take  4 each am x 2 days,   2 each am x 2 days,  1 each am x 2 days and stop  Pantoprazole (protonix) 40 mg   Take 30-60 min before first meal of the day and Pepcid 20 mg one bedtime and chlortrimeton 60m at bedtime until return to office -  GERD diet. For cough use delsym or mucinex dm to suppress the urge to cough    01/22/2013 f/u ov/[name] re chronic cough Chief Complaint  Patient presents with  . Follow-up    Pt states cough is mcuh improved since his last visit. Still coughing up min light yellow sputum a couple times per day. No new co's.  not limited by sob, most of his mucus prod in ams but not premature awakending. Using netti pot but not nasonex (sometimes use it prn when "nose bad") rec Start back on nasonex twice daily  If flare ok to take Prednisone 10 mg take  4 each am x 2 days,   2 each am x 2 days,  1 each am x 2 days and stop    03/08/2013 f/u ov/[name] re chronic cough Chief Complaint  Patient presents with  . 6 Week Follow Up    Cough is still present, unchanged. Reports production of yellow/green mucus. Cough is consistent throughout the day.   did not stay on meds as rec, confused with names  of meds he is on. No noct disturbance at all. Says "zero" response to everything we've done - not even transiently better with prednisone  rec  repeat sinus CT > chronic changes/ no air fluid levels  Stop cozar (losaartan) and start micardis 40 mg samples take one daily  Stop nasonex and start dymista one twice daily each nostril  For cough > mucinex dm 1200 mg every 12 hours as needed    04/05/2013 f/u ov/[name] re: cough x 2 years Chief Complaint  Patient presents with  . Follow-up    Pt states cough had improved until he developed sinus infection approx 5 days after his last visit. Cough is prod with moderate amount tan/yellow sputum.  He also c/o minoe sore throat.   cough is worse early in am  And also in pm if uses his voice. Still using vit D oil based vit dymista helping nasal drainage better than anything else. Did not maintain on gerd rx  rec augmentin 875 twice daily x 10 days Prednisone 10 mg take  4 each am x 2 days, 2 each am x 2 days,  1 each am x 2 days and stop  Take pepcid  64m and chlortrimeton 4 mg at bedtime as long as coughing Continue dymista and add singulair 10 mg daily for at least a month and  stop if not helping your cough or nose  GERD diet  If not satisfied then next step is to return to Dr BKarrie Doffing   03/08/2016  Consultation/ [name] re:  Abnormal CT chest c/w bronchiectasis  Chief Complaint  Patient presents with  . Pulmonary Consult    Referred by Dr. KHulan Fess Pt last seen Oct 2014. He had sinus surgery in Jan 2017 and his cough has improved some since then. He has occ cough with yellow sputum.    Cough never completely resolved x 2012   is most prod p stirs in am / always yellow even p abx/ one episode of hemotpysis x 3 m prior to OV but this was in setting of epistaxis and ongoing nasal congestion that only improved transiently p sinus surgery rec Bronchiectasis  Pathophysiology  Whenever you develop cough congestion take mucinex or mucinex dm  1200 mg every 12 hours  Symbicort 80 Take 2 puffs first thing in am and then another 2 puffs about 12 hours later.  Work on inhaler technique:  Please schedule a follow up office visit in 6 weeks, call sooner if needed with pfts - ok to push back so we do both same day     04/25/2016  f/u ov/[name] re:  COPD GOLD II / MPNs with bronchiectasis  Chief Complaint  Patient presents with  . Follow-up    PFT's today. Cough has been about the same. No new co's today.  no better on symbicort / same cough still following up with ent on rx for sinus dz rec Bevespi Take 2 puffs first thing in am and then another 2 puffs about 12 hours later  trial >> if cough or breathing better continue if not stop For cough ok to use mucinex dm up to 1200 mg every 12 hours and the flutter valve as much as possible  Add: if truly refractory symptoms/ purulent sputum next step is FOB/ bal     06/07/2016  f/u ov/[name] re: GOLD II copd/bronchiectasis /sinusitis  Chief Complaint  Patient presents with  . Follow-up    Cough has been worse. He is coughing up yellow/tan sputum.    worse first thing in am/ using flutter / cough lasts sev hours and also while sleeping / no better with symb or bevespi in terms of cough but . Not limited by breathing from desired activities   rec Augmentin 875 mg take one pill twice daily  X 20 days - take at breakfast and supper with large glass of water.  It would help reduce the usual side effects (diarrhea and yeast infections) if you ate cultured yogurt at lunch.  Continue pepcid 20 mg at bedtime until return  for cough > mucinex dm 1200 mg every 12 hours as needed and flutter as much as possible    07/19/2016  f/u ov/[name] re:  GOLD II/ bronchiectasis/sinusitis Chief Complaint  Patient presents with  . Follow-up    Cough is unchanged. He states having some PND.    no better p 20 days augmentin with cough For several hours each morning and also when he lies down at night and no change  on versus off inhalers including Bevespi and Symbicort. rec Prednisone 10 mg take  4 each am x 2 days,   2 each am x 2 days,  1 each  am x 2 days and stop  Pantoprazole (protonix) 40 mg   Take  30-60 min before first meal of the day and Pepcid (famotidine)  20 mg one @  bedtime until return to office  GERD     08/28/2016  f/u ov/[name] re:  GOLD II /bronchiectasis and sinusitis and no inhalers  Chief Complaint  Patient presents with  . Follow-up    Cough has improved some, but not resolved. He just finished amox due to just having dental work. His breathing is unchanged.   worst cough is in am x 15 min clearly better while on abx vs off, no better on pred on inhalers  rec Please see patient coordinator before you leave today  to schedule sinus CT  For cough mucinex dm up to 1200 mg every 12hours with the flutter valve     10/09/2016  f/u ov/[name] re:   Bronchiectasis/ rhinitis / sinusitis  Chief Complaint  Patient presents with  . Follow-up    6 week follow up. States cough is better now. Denies any other symptoms  no longer waking up coughing Lots of runny nose but denies excess/ purulent sputum or mucus plugs   Taking subtherapeutic doses of mucinex,dm on a maint/ not prn basis   Not limited by breathing from desired activities    No obvious day to day or daytime variability or assoc   hemoptysis or cp or chest tightness, subjective wheeze or overt sinus or hb symptoms. No unusual exp hx or h/o childhood pna/ asthma or knowledge of premature birth.  Sleeping ok without nocturnal  or early am exacerbation  of respiratory  c/o's or need for noct saba. Also denies any obvious fluctuation of symptoms with weather or environmental changes or other aggravating or alleviating factors except as outlined above   Current Medications, Allergies, Complete Past Medical History, Past Surgical History, Family History, and Social History were reviewed in [employer]  record.  ROS  The following are not active complaints unless bolded sore throat, dysphagia, dental problems, itching, sneezing,  nasal congestion or excess/ purulent secretions, ear ache,   fever, chills, sweats, unintended wt loss, classically pleuritic or exertional cp,  orthopnea pnd or leg swelling, presyncope, palpitations, abdominal pain, anorexia, nausea, vomiting, diarrhea  or change in bowel or bladder habits, change in stools or urine, dysuria,hematuria,  rash, arthralgias, visual complaints, headache, numbness, weakness or ataxia or problems with walking or coordination,  change in mood/affect or memory.                  Objective:   Physical Exam   amb wm  nad   Vital signs reviewed     - Note on arrival 02 sats  90% on RA     03/08/2013       200 > 04/05/2013   194 >  03/08/2016  179  > 04/25/2016  178 > 06/07/2016  178 > 08/28/2016  178 > 10/09/2016  174     01/22/13 199 lb 12.8 oz (90.629 kg)  01/08/13 197 lb (89.359 kg)       HEENT: nl dentition,  and oropharynx. Nl external ear canals without cough reflex- moderate bilateral non-specific turbinate edema  And MP secretions very thick on L    NECK :  without JVD/Nodes/TM/ nl carotid upstrokes bilaterally   LUNGS: no acc muscle use,      CV:  RRR  no s3 or murmur or increase in P2, no edema  ABD:  soft and nontender with nl excursion in the supine position. No bruits or organomegaly, bowel sounds nl  MS:  warm without deformities, calf tenderness, cyanosis or clubbing  SKIN: warm and dry without lesions        CT chest 08/15/16  (low dose screening) Compared to the prior study there has been a dramatic increased in extensive peribronchovascular nodularity throughout the mid to lower lungs bilaterally. This is technically considered Lung-RADS Category 4BS, suspicious, based off of size alone, however, the overall spectrum of findings is strongly favored to be infectious.          Assessment & Plan:

## 2016-10-09 NOTE — Patient Instructions (Addendum)
For cough / congestion as needed can take up to 1200 mg of mucinex dm every 12 hours   Follow up with Dr Redmond Baseman and keep using the Nettipot    Try symbicort 80 Take 2 puffs first thing in am and then another 2 puffs about 12 hours later.    and if works >> continue it and, if not change to symbicort 160 Take 2 puffs first thing in am and then another 2 puffs about 12 hours later. X trial of one month    Work on perfecting  inhaler technique:  relax and gently blow all the way out then take a nice smooth deep breath back in, triggering the inhaler at same time you start breathing in.  Hold for up to 5 seconds if you can. Blow out thru nose. Rinse and gargle with water when done       Please schedule a follow up visit in 3 months but call sooner if needed

## 2016-10-10 NOTE — Assessment & Plan Note (Addendum)
CT chest 01/17/16  (low dose screening) Emphysema with innumerable peripheral tiny pulmonary nodules scattered mainly in a peribronchovascular distribution and associated with bronchial wall thickening, bronchiectasis, and airway impaction. These changes are associated with areas of irregular airspace consolidation mainly in the dependent lower lobes bilaterally - 03/08/2016    90% > try symbicort 80 2bid and return for pfts  - PFT's  04/25/2016  FEV1 1.94 (67 % ) ratio 65   p 4 % improvement from saba p nothing prior to study with DLCO  67/66 % corrects to 76  % for alv volume   - Flutter Valve rx  04/25/16  - 06/07/2016 augmentin bid x 20 days> no better as of 07/19/2016  - Allergy profile  07/19/16  >  Eos 0.2/  IgE 6 neg RAST  - Alpha one Screen 07/19/16 m>  MM - CT 08/15/16 low dose  Compared to the prior study there has been a dramatic increased in extensive peribronchovascular nodularity throughout the mid to lower lungs bilaterally. This is technically considered Lung-RADS Category 4BS, suspicious, based off of size alone, however, the overall spectrum of findings is strongly favored to be infectious. - Sinus CT 09/04/2016 mucosal thickening in both maxillary sinuses. There is mucosal thickening within the frontal sinus, within the few remaining ethmoid air cells, and within the sphenoid sinus. No definite air-fluid level is seen.   His exam is striking for persistent obvious nasal tone/ congestion with mucopurulent material now partially obstruction him on exam on the Left   rec repeat ENT eval and if not fruitful then consider FOB before next emprical trial of abx  I had an extended discussion with the patient reviewing all relevant studies completed to date and  lasting 15 to 20 minutes of a 25 minute visit    Each maintenance medication was reviewed in detail including most importantly the difference between maintenance and prns and under what circumstances the prns are to be  triggered using an action plan format that is not reflected in the computer generated alphabetically organized AVS.    Please see AVS for specific instructions unique to this visit that I personally wrote and verbalized to the the pt in detail and then reviewed with pt  by my nurse highlighting any  changes in therapy recommended at today's visit to their plan of care.

## 2016-10-10 NOTE — Assessment & Plan Note (Signed)
-   03/08/2016    90% > try symbicort 80 2bid and return for pfts  - PFT's  04/25/2016  FEV1 1.94 (67 % ) ratio 65   p 4 % improvement from saba p nothing prior to study with DLCO  67/66 % corrects to 76  % for alv volume   - 04/25/2016    try bevespi samples> no better so d/c'd - Alpha one AT 07/19/16 >  MM - Allergy profile 07/19/16  >  Eos 0.2 /  IgE 6 with neg RAST  - 10/09/2016  After extensive coaching HFA effectiveness =    90% rechallenge with first symb 80 then symb 160   His pattern of cough is more related to AB than copd per se and he's never had a trial of higher doses of symbicort so free samples given to try this approach - if nothing else should help him mobilize secretions better.

## 2016-11-13 ENCOUNTER — Ambulatory Visit (INDEPENDENT_AMBULATORY_CARE_PROVIDER_SITE_OTHER)
Admission: RE | Admit: 2016-11-13 | Discharge: 2016-11-13 | Disposition: A | Discharge status: Home / Self Care | Payer: Medicare Other | Source: Ambulatory Visit | Attending: Acute Care | Admitting: Acute Care

## 2016-11-13 DIAGNOSIS — R918 Other nonspecific abnormal finding of lung field: Secondary | ICD-10-CM | POA: Diagnosis not present

## 2016-11-13 DIAGNOSIS — Z87891 Personal history of nicotine dependence: Secondary | ICD-10-CM

## 2016-11-15 ENCOUNTER — Other Ambulatory Visit: Payer: Self-pay | Admitting: Acute Care

## 2016-11-15 DIAGNOSIS — Z87891 Personal history of nicotine dependence: Secondary | ICD-10-CM

## 2016-11-22 ENCOUNTER — Telehealth: Payer: Self-pay | Admitting: Acute Care

## 2016-11-25 ENCOUNTER — Ambulatory Visit: Payer: Medicare Other | Admitting: Internal Medicine

## 2016-11-25 NOTE — Telephone Encounter (Signed)
Will forward to lung nodule pool.  

## 2016-11-26 NOTE — Telephone Encounter (Signed)
Results have been faxed to PCP and order placed for 3 mth f/u CT.  Will await pt call back.

## 2016-12-12 NOTE — Telephone Encounter (Signed)
Langley Gauss can we close this message?  thanks

## 2016-12-16 NOTE — Telephone Encounter (Signed)
Spoke with pt and informed of ct results and need for 3 mth f/u CT.  PT verbalized understanding.  [facility] will schedule CT and contact pt with date and time.  Nothing further needed.

## 2017-01-08 ENCOUNTER — Ambulatory Visit: Payer: Medicare Other | Admitting: Internal Medicine

## 2017-01-22 ENCOUNTER — Encounter: Payer: Self-pay | Admitting: Internal Medicine

## 2017-01-22 ENCOUNTER — Ambulatory Visit (INDEPENDENT_AMBULATORY_CARE_PROVIDER_SITE_OTHER): Payer: Medicare Other | Admitting: Internal Medicine

## 2017-01-22 VITALS — BP 122/62 | HR 65 | Ht 68.0 in | Wt 168.8 lb

## 2017-01-22 DIAGNOSIS — J449 Chronic obstructive pulmonary disease, unspecified: Secondary | ICD-10-CM

## 2017-01-22 DIAGNOSIS — R918 Other nonspecific abnormal finding of lung field: Secondary | ICD-10-CM | POA: Diagnosis not present

## 2017-01-22 MED ORDER — BUDESONIDE-FORMOTEROL FUMARATE 160-4.5 MCG/ACT IN AERO: 2.0000 | INHALATION_SPRAY | Freq: Two times a day (BID) | RESPIRATORY_TRACT | 11 refills | Status: DC

## 2017-01-22 MED ORDER — BUDESONIDE-FORMOTEROL FUMARATE 160-4.5 MCG/ACT IN AERO: 2.0000 | INHALATION_SPRAY | Freq: Two times a day (BID) | RESPIRATORY_TRACT | 0 refills | Status: DC

## 2017-01-22 NOTE — Progress Notes (Signed)
Subjective:    Patient ID: [name], male    DOB: 1940/09/10  MRN: [id number]    Brief patient profile:  46 yowm quit smoking 2006 with chronic cough at that point which completely resolved p quit smoking although continued then and now to do wood working with onset new cough while on ACEi Dec 2012   Referred 01/08/2013 to pulmonary clinic by Dr Janace Hoard for cough.    History of Present Illness  01/08/2013 1st pulmonary eval/[name] stopped acei in April 2014 cc cough daily x 1.5 y w/in10 min of stirring and brings up tan mucus maybe a half a cup total  by lunch, seems worse sitting. Some sinus drainage better with allegra.  Does not remember abx or prednisone being used and w/u by Dr Janace Hoard was c/w chronic rinitis and ? gerd but  Not taking meds consistently. rec Prednisone 10 mg take  4 each am x 2 days,   2 each am x 2 days,  1 each am x 2 days and stop  Pantoprazole (protonix) 40 mg   Take 30-60 min before first meal of the day and Pepcid 20 mg one bedtime and chlortrimeton 65m at bedtime until return to office -  GERD diet. For cough use delsym or mucinex dm to suppress the urge to cough    01/22/2013 f/u ov/[name] re chronic cough Chief Complaint  Patient presents with  . Follow-up    Pt states cough is mcuh improved since his last visit. Still coughing up min light yellow sputum a couple times per day. No new co's.  not limited by sob, most of his mucus prod in ams but not premature awakending. Using netti pot but not nasonex (sometimes use it prn when "nose bad") rec Start back on nasonex twice daily  If flare ok to take Prednisone 10 mg take  4 each am x 2 days,   2 each am x 2 days,  1 each am x 2 days and stop    03/08/2013 f/u ov/[name] re chronic cough Chief Complaint  Patient presents with  . 6 Week Follow Up    Cough is still present, unchanged. Reports production of yellow/green mucus. Cough is consistent throughout the day.   did not stay on meds as rec, confused with names  of meds he is on. No noct disturbance at all. Says "zero" response to everything we've done - not even transiently better with prednisone  rec  repeat sinus CT > chronic changes/ no air fluid levels  Stop cozar (losaartan) and start micardis 40 mg samples take one daily  Stop nasonex and start dymista one twice daily each nostril  For cough > mucinex dm 1200 mg every 12 hours as needed    04/05/2013 f/u ov/[name] re: cough x 2 years Chief Complaint  Patient presents with  . Follow-up    Pt states cough had improved until he developed sinus infection approx 5 days after his last visit. Cough is prod with moderate amount tan/yellow sputum.  He also c/o minoe sore throat.   cough is worse early in am  And also in pm if uses his voice. Still using vit D oil based vit dymista helping nasal drainage better than anything else. Did not maintain on gerd rx  rec augmentin 875 twice daily x 10 days Prednisone 10 mg take  4 each am x 2 days, 2 each am x 2 days,  1 each am x 2 days and stop  Take pepcid  64m and chlortrimeton 4 mg at bedtime as long as coughing Continue dymista and add singulair 10 mg daily for at least a month and  stop if not helping your cough or nose  GERD diet  If not satisfied then next step is to return to Dr BKarrie Doffing   03/08/2016  Consultation/ [name] re:  Abnormal CT chest c/w bronchiectasis  Chief Complaint  Patient presents with  . Pulmonary Consult    Referred by Dr. KHulan Fess Pt last seen Oct 2014. He had sinus surgery in Jan 2017 and his cough has improved some since then. He has occ cough with yellow sputum.    Cough never completely resolved x 2012   is most prod p stirs in am / always yellow even p abx/ one episode of hemotpysis x 3 m prior to OV but this was in setting of epistaxis and ongoing nasal congestion that only improved transiently p sinus surgery rec Bronchiectasis  Pathophysiology  Whenever you develop cough congestion take mucinex or mucinex dm  1200 mg every 12 hours  Symbicort 80 Take 2 puffs first thing in am and then another 2 puffs about 12 hours later.  Work on inhaler technique:  Please schedule a follow up office visit in 6 weeks, call sooner if needed with pfts - ok to push back so we do both same day     04/25/2016  f/u ov/[name] re:  COPD GOLD II / MPNs with bronchiectasis  Chief Complaint  Patient presents with  . Follow-up    PFT's today. Cough has been about the same. No new co's today.  no better on symbicort / same cough still following up with ent on rx for sinus dz rec Bevespi Take 2 puffs first thing in am and then another 2 puffs about 12 hours later  trial >> if cough or breathing better continue if not stop For cough ok to use mucinex dm up to 1200 mg every 12 hours and the flutter valve as much as possible  Add: if truly refractory symptoms/ purulent sputum next step is FOB/ bal     06/07/2016  f/u ov/[name] re: GOLD II copd/bronchiectasis /sinusitis  Chief Complaint  Patient presents with  . Follow-up    Cough has been worse. He is coughing up yellow/tan sputum.    worse first thing in am/ using flutter / cough lasts sev hours and also while sleeping / no better with symb or bevespi in terms of cough but . Not limited by breathing from desired activities   rec Augmentin 875 mg take one pill twice daily  X 20 days - take at breakfast and supper with large glass of water.  It would help reduce the usual side effects (diarrhea and yeast infections) if you ate cultured yogurt at lunch.  Continue pepcid 20 mg at bedtime until return  for cough > mucinex dm 1200 mg every 12 hours as needed and flutter as much as possible    07/19/2016  f/u ov/[name] re:  GOLD II/ bronchiectasis/sinusitis Chief Complaint  Patient presents with  . Follow-up    Cough is unchanged. He states having some PND.    no better p 20 days augmentin with cough For several hours each morning and also when he lies down at night and no change  on versus off inhalers including Bevespi and Symbicort. rec Prednisone 10 mg take  4 each am x 2 days,   2 each am x 2 days,  1 each  am x 2 days and stop  Pantoprazole (protonix) 40 mg   Take  30-60 min before first meal of the day and Pepcid (famotidine)  20 mg one @  bedtime until return to office  GERD     08/28/2016  f/u ov/[name] re:  GOLD II /bronchiectasis and sinusitis and no inhalers  Chief Complaint  Patient presents with  . Follow-up    Cough has improved some, but not resolved. He just finished amox due to just having dental work. His breathing is unchanged.   worst cough is in am x 15 min clearly better while on abx vs off, no better on pred on inhalers  rec Please see patient coordinator before you leave today  to schedule sinus CT  For cough mucinex dm up to 1200 mg every 12hours with the flutter valve     10/09/2016  f/u ov/[name] re:   Bronchiectasis/ rhinitis / sinusitis  Chief Complaint  Patient presents with  . Follow-up    6 week follow up. States cough is better now. Denies any other symptoms  no longer waking up coughing Lots of runny nose but denies excess/ purulent sputum or mucus plugs   Taking subtherapeutic doses of mucinex,dm on a maint/ not prn basis  Not limited by breathing from desired activities   rec For cough / congestion as needed can take up to 1200 mg of mucinex dm every 12 hours  Follow up with Dr Redmond Baseman and keep using the Nettipot  Try symbicort 80 Take 2 puffs first thing in am and then another 2 puffs about 12 hours later.  and if works >> continue it and, if not change to symbicort 160 Take 2 puffs first thing in am and then another 2 puffs about 12 hours later. X trial of one month Work on Engineer, technical sales technique:    01/22/2017  f/u ov/[name] re: copd GOLD II /  bronhiectasis/ sinusitis > did not see Redmond Baseman yet Chief Complaint  Patient presents with  . Follow-up    Pt still has productive cough that has improved with medicines and does  have SOB on exertion. Denies any CP.     No change doe = MMRC2 = can't walk a nl pace on a flat grade s sob but does fine slow and flat   Has not seen ent yet as rec Overall better on symb 160 than 80 so maint the 160     No obvious day to day or daytime variability or assoc   mucus plugs or hemoptysis or cp or chest tightness, subjective wheeze or overt sinus or hb symptoms. No unusual exp hx or h/o childhood pna/ asthma or knowledge of premature birth.  Sleeping ok without nocturnal  or early am exacerbation  of respiratory  c/o's or need for noct saba. Also denies any obvious fluctuation of symptoms with weather or environmental changes or other aggravating or alleviating factors except as outlined above   Current Medications, Allergies, Complete Past Medical History, Past Surgical History, Family History, and Social History were reviewed in [employer] record.  ROS  The following are not active complaints unless bolded sore throat, dysphagia, dental problems, itching, sneezing,  nasal congestion or excess/ purulent secretions, ear ache,   fever, chills, sweats, unintended wt loss, classically pleuritic or exertional cp,  orthopnea pnd or leg swelling, presyncope, palpitations, abdominal pain, anorexia, nausea, vomiting, diarrhea  or change in bowel or bladder habits, change in stools or urine, dysuria,hematuria,  rash, arthralgias, visual complaints, headache, numbness, weakness or ataxia or problems with walking or coordination,  change in mood/affect or memory.                            Objective:   Physical Exam   amb wm  nad   Vital signs reviewed     - Note on arrival 02 sats  95% on RA     03/08/2013       200 > 04/05/2013   194 >  12/2016  178 > 10/09/2016  174 > 01/22/2017   168      01/22/13 199 lb 12.8 oz (90.629 kg)  01/08/13 197 lb (89.359 kg)       HEENT: nl dentition,  and oropharynx. Nl external ear canals without cough reflex- moderate  bilateral non-specific turbinate edema       NECK :  without JVD/Nodes/TM/ nl carotid upstrokes bilaterally   LUNGS: no acc muscle use,    Minimal insp and exp rhonchi bilaterally   CV:  RRR  no s3 or murmur or increase in P2, no edema   ABD:  soft and nontender with nl excursion in the supine position. No bruits or organomegaly, bowel sounds nl  MS:  warm without deformities, calf tenderness, cyanosis or clubbing  SKIN: warm and dry without lesions             Assessment & Plan:

## 2017-01-22 NOTE — Patient Instructions (Signed)
Automatic = symbicort 160 Take 2 puffs first thing in am and then another 2 puffs about 12 hours later.    Work on inhaler technique:  relax and gently blow all the way out then take a nice smooth deep breath back in, triggering the inhaler at same time you start breathing in.  Hold for up to 5 seconds if you can. Blow out thru nose. Rinse and gargle with water when done      As needed for cough/ congestion >  mucinex or mucinex dm 1200  Mg every 12 hours with the flutter valve as much as possible  Keep appt to see Dr Redmond Baseman  Please schedule a follow up visit in 6 months but call sooner if needed

## 2017-01-23 NOTE — Assessment & Plan Note (Signed)
-   03/08/2016    90% > try symbicort 80 2bid and return for pfts  - PFT's  04/25/2016  FEV1 1.94 (67 % ) ratio 65   p 4 % improvement from saba p nothing prior to study with DLCO  67/66 % corrects to 76  % for alv volume   - 04/25/2016    try bevespi samples> no better so d/c'd - Alpha one AT 07/19/16 >  MM - Allergy profile 07/19/16  >  Eos 0.2 /  IgE 6 with neg RAST  - 10/09/2016  After extensive coaching HFA effectiveness =    90% rechallenge with first symb 80 then symb 160  01/22/2017  After extensive coaching HFA effectiveness =    90%   Adequate control on present rx, reviewed in detail with pt > no change in rx needed    Each maintenance medication was reviewed in detail including most importantly the difference between maintenance and as needed and under what circumstances the prns are to be used.  Please see AVS for specific  Instructions which are unique to this visit and I personally typed out  which were reviewed in detail in writing with the patient and a copy provided.

## 2017-01-23 NOTE — Assessment & Plan Note (Signed)
CT chest 01/17/16  (low dose screening) Emphysema with innumerable peripheral tiny pulmonary nodules scattered mainly in a peribronchovascular distribution and associated with bronchial wall thickening, bronchiectasis, and airway impaction. These changes are associated with areas of irregular airspace consolidation mainly in the dependent lower lobes bilaterally - 03/08/2016    90% > try symbicort 80 2bid and return for pfts  - PFT's  04/25/2016  FEV1 1.94 (67 % ) ratio 65   p 4 % improvement from saba p nothing prior to study with DLCO  67/66 % corrects to 76  % for alv volume   - Flutter Valve rx  04/25/16  - 06/07/2016 augmentin bid x 20 days> no better as of 07/19/2016  - Allergy profile  07/19/16  >  Eos 0.2/  IgE 6 neg RAST  - Alpha one Screen 07/19/16 m>  MM - CT 08/15/16 low dose  Compared to the prior study there has been a dramatic increased in extensive peribronchovascular nodularity throughout the mid to lower lungs bilaterally. This is technically considered Lung-RADS Category 4BS, suspicious, based off of size alone, however, the overall spectrum of findings is strongly favored to be infectious. - Sinus CT 09/04/2016 mucosal thickening in both maxillary sinuses. There is mucosal thickening within the frontal sinus, within the few remaining ethmoid air cells, and within the sphenoid sinus. No definite air-fluid level is Seen > referred to ent again 01/22/2017    Scheduled for f/u CT 02/14/17 per guidelines

## 2017-02-14 ENCOUNTER — Ambulatory Visit (INDEPENDENT_AMBULATORY_CARE_PROVIDER_SITE_OTHER)
Admission: RE | Admit: 2017-02-14 | Discharge: 2017-02-14 | Disposition: A | Discharge status: Home / Self Care | Payer: Medicare Other | Source: Ambulatory Visit | Attending: Acute Care | Admitting: Acute Care

## 2017-02-14 DIAGNOSIS — Z87891 Personal history of nicotine dependence: Secondary | ICD-10-CM

## 2017-02-14 DIAGNOSIS — R918 Other nonspecific abnormal finding of lung field: Secondary | ICD-10-CM

## 2017-02-18 ENCOUNTER — Telehealth: Payer: Self-pay | Admitting: Acute Care

## 2017-02-18 DIAGNOSIS — R1310 Dysphagia, unspecified: Secondary | ICD-10-CM

## 2017-02-18 NOTE — Telephone Encounter (Signed)
Dr. Melvyn Novas, I have been following [name] for abnormal low-dose screening CTs. This patient has had abnormal CTs since July 2017:  July 2017: Lung RADS 3>> 6 month follow up February 2018:lung RADS 4B>> 3 month follow up May 2018: lung RADS 4A >> 3 month follow up February 14, 2017: lung RADS 4B>> suggested 3 month follow up with swallow eval for aspiration.  Per the radiology reports imaging appearance is most compatible with infectious or inflammatory etiology favoring recurrent aspiration. Suggestion per the last imaging which was done 02/14/2017 is for speech swallow eval to assess for aspiration. Recommendation was for a short-term follow-up chest CT in 3-6 months. Per your note January 2018 you had suggested CT sinuses, followed by bronch if CT sinuses was negative. CT sinuses was unremarkable, however bronch has not been ordered or  Scheduled. This patient also has significant bronchiectasis per CT. I spoke with Dr. Lamonte Sakai who felt we needed to see the patient, and perhaps schedule him for a bronch. What are your thoughts? Would you like to see the patient? Maybe plan a bronch? Get a swallow? Please let me know. I will call the patient and let him know we are discussing the best options for follow up and that we will call him with recommendations. Thanks. Judson Roch

## 2017-02-18 NOTE — Telephone Encounter (Signed)
So we don't get confused about who is doing what, I'll arrange the f/u  I complete the evaluation of the active problem

## 2017-02-18 NOTE — Telephone Encounter (Signed)
Please call patient and let him know that Dr. Melvyn Novas has responded to my message. Let him know that Dr. Melvyn Novas wants [place] to schedule a diagnostic esophagram to evaluate his swallow, with a follow-up appointment with Dr. Melvyn Novas to discuss whether or not to move forward with a bronchoscopy. Please place order for diagnostic esophagram, and schedule with Dr. Melvyn Novas at first available appointment after esophagram has been done. Thank you.

## 2017-02-18 NOTE — Telephone Encounter (Signed)
Do you want me to schedule a 3-6 month follow up CT Chest, or will you do that based on results of the DG esophogram and bronch?

## 2017-02-18 NOTE — Telephone Encounter (Signed)
Schedule a DgEsophogram and f/u with me to regroup and consider bronchoscopy then

## 2017-02-18 NOTE — Telephone Encounter (Signed)
lmtcb x1 for pt. 

## 2017-02-18 NOTE — Telephone Encounter (Signed)
See previous note

## 2017-02-25 NOTE — Telephone Encounter (Signed)
Spoke with pt and informed of need for esophagram to evaluate his swallowing per Dr Melvyn Novas.  Pt verbalized understanding.  Order was placed and pt was advised that he would be contacted regarding appt and will need a f/u appt with Dr Melvyn Novas once this study is complete to discuss next step.  Nothing further needed at this time.

## 2017-02-25 NOTE — Telephone Encounter (Signed)
See other phone note from 02/18/17.  Will close this message .

## 2017-02-28 ENCOUNTER — Ambulatory Visit (HOSPITAL_COMMUNITY)
Admission: RE | Admit: 2017-02-28 | Discharge: 2017-02-28 | Disposition: A | Discharge status: Home / Self Care | Payer: Medicare Other | Source: Ambulatory Visit | Attending: Internal Medicine | Admitting: Internal Medicine

## 2017-02-28 DIAGNOSIS — R1310 Dysphagia, unspecified: Secondary | ICD-10-CM | POA: Insufficient documentation

## 2017-03-03 NOTE — Progress Notes (Signed)
LMTCB

## 2017-03-04 ENCOUNTER — Other Ambulatory Visit: Payer: Self-pay | Admitting: Acute Care

## 2017-03-04 ENCOUNTER — Telehealth: Payer: Self-pay | Admitting: Acute Care

## 2017-03-04 DIAGNOSIS — Z87891 Personal history of nicotine dependence: Secondary | ICD-10-CM

## 2017-03-04 DIAGNOSIS — R918 Other nonspecific abnormal finding of lung field: Secondary | ICD-10-CM

## 2017-03-04 NOTE — Progress Notes (Signed)
LMTCB

## 2017-03-04 NOTE — Telephone Encounter (Signed)
[name], I have spoken with Dr. Melvyn Novas. Please schedule this patient for a super D CT chest November 2018. Based on results of the CT, patient may have bronchoscopy per Dr. Melvyn Novas. Thank you

## 2017-03-18 DIAGNOSIS — Z8546 Personal history of malignant neoplasm of prostate: Secondary | ICD-10-CM | POA: Diagnosis not present

## 2017-04-26 LAB — GLUCOSE, POCT (MANUAL RESULT ENTRY): POC Glucose: 103 mg/dl — AB (ref 70–99)

## 2017-04-28 DIAGNOSIS — G894 Chronic pain syndrome: Secondary | ICD-10-CM | POA: Diagnosis not present

## 2017-04-28 DIAGNOSIS — Z5181 Encounter for therapeutic drug level monitoring: Secondary | ICD-10-CM | POA: Diagnosis not present

## 2017-04-28 DIAGNOSIS — M791 Myalgia, unspecified site: Secondary | ICD-10-CM | POA: Diagnosis not present

## 2017-04-28 DIAGNOSIS — M170 Bilateral primary osteoarthritis of knee: Secondary | ICD-10-CM | POA: Diagnosis not present

## 2017-05-19 ENCOUNTER — Ambulatory Visit (INDEPENDENT_AMBULATORY_CARE_PROVIDER_SITE_OTHER)
Admission: RE | Admit: 2017-05-19 | Discharge: 2017-05-19 | Disposition: A | Discharge status: Home / Self Care | Payer: Medicare Other | Source: Ambulatory Visit | Attending: Acute Care | Admitting: Acute Care

## 2017-05-19 DIAGNOSIS — R918 Other nonspecific abnormal finding of lung field: Secondary | ICD-10-CM

## 2017-05-19 DIAGNOSIS — Z87891 Personal history of nicotine dependence: Secondary | ICD-10-CM

## 2017-05-23 ENCOUNTER — Encounter: Payer: Self-pay | Admitting: Internal Medicine

## 2017-05-23 NOTE — Telephone Encounter (Signed)
-----   Message -----    From: Dwana Curd    Sent: 05/23/2017  5:01 PM EST      To: Christinia Gully, MD Subject: Non-Urgent Medical Question  Read latest results of ct scan. What does it mean? Thanks and have nice a weekend!   MW please advise. Thanks.

## 2017-06-19 ENCOUNTER — Telehealth: Payer: Self-pay | Admitting: Acute Care

## 2017-06-19 NOTE — Telephone Encounter (Signed)
LDCT  Received: 6 days ago  Message Contents  Magdalen Spatz, NP  Christie Beckers, RN          Previous Messages    ----- Message -----  From: Tanda Rockers, MD  Sent: 06/13/2017  4:57 PM  To: Magdalen Spatz, NP  Subject: RE: F/U LDCT                   That sound good - I'll discuss the problem with him on return  ----- Message -----  From: Magdalen Spatz, NP  Sent: 06/13/2017  2:10 PM  To: Tanda Rockers, MD  Subject: RE: F/U LDCT                   Do you want me to take him out of the screening population and let you follow this area yourself?  ----- Message -----  From: Tanda Rockers, MD  Sent: 06/12/2017  5:11 AM  To: Magdalen Spatz, NP  Subject: RE: F/U LDCT                   He has bronchiectasis which makes it much more difficult to do "screening studies" and my own personal approach with this type of pt is to follow with plain films unless there is some compelling reason to do otherwise.  I'll see him in feb 2019 for regular appt and discuss it with him then unless there's something I'm missing here  ----- Message -----  From: Magdalen Spatz, NP  Sent: 06/11/2017 11:48 PM  To: Tanda Rockers, MD  Subject: FW: F/U LDCT                   Are you going to schedule the follow up scan on this patient?  Please let me know.  Thanks  ----- Message -----  From: Christie Beckers, RN  Sent: 06/06/2017  8:53 AM  To: Magdalen Spatz, NP  Subject: F/U LDCT                     Can you look at this pt and let me know when he will be due for LDCT f/u?  Thanks

## 2017-07-25 ENCOUNTER — Encounter: Payer: Self-pay | Admitting: Internal Medicine

## 2017-07-25 ENCOUNTER — Ambulatory Visit (INDEPENDENT_AMBULATORY_CARE_PROVIDER_SITE_OTHER): Payer: Medicare Other | Admitting: Internal Medicine

## 2017-07-25 ENCOUNTER — Ambulatory Visit (INDEPENDENT_AMBULATORY_CARE_PROVIDER_SITE_OTHER)
Admission: RE | Admit: 2017-07-25 | Discharge: 2017-07-25 | Disposition: A | Discharge status: Home / Self Care | Payer: Medicare Other | Source: Ambulatory Visit | Attending: Internal Medicine | Admitting: Internal Medicine

## 2017-07-25 VITALS — BP 130/60 | HR 74 | Ht 68.0 in | Wt 177.4 lb

## 2017-07-25 DIAGNOSIS — R918 Other nonspecific abnormal finding of lung field: Secondary | ICD-10-CM | POA: Diagnosis not present

## 2017-07-25 MED ORDER — PANTOPRAZOLE SODIUM 40 MG PO TBEC: 40.0000 mg | DELAYED_RELEASE_TABLET | Freq: Every day | ORAL | 2 refills | Status: DC

## 2017-07-25 NOTE — Progress Notes (Signed)
Subjective:    Patient ID: [name], male    DOB: [birth year]  MRN: [id number]    Brief patient profile:  77 yowm quit smoking 2006 with chronic cough at that point which completely resolved p quit smoking although continued then and now to do wood working with onset new cough while on ACEi Dec 2012   Referred 01/08/2013 to pulmonary clinic by Dr Janace Hoard for cough with documented bronchiectasis on CT 01/17/2016    History of Present Illness  01/08/2013 1st pulmonary eval/[name] stopped acei in April 2014 cc cough daily x 1.5 y w/in10 min of stirring and brings up tan mucus maybe a half a cup total  by lunch, seems worse sitting. Some sinus drainage better with allegra.  Does not remember abx or prednisone being used and w/u by Dr Janace Hoard was c/w chronic rinitis and ? gerd but  Not taking meds consistently. rec Prednisone 10 mg take  4 each am x 2 days,   2 each am x 2 days,  1 each am x 2 days and stop  Pantoprazole (protonix) 40 mg   Take 30-60 min before first meal of the day and Pepcid 20 mg one bedtime and chlortrimeton 40m at bedtime until return to office -  GERD diet. For cough use delsym or mucinex dm to suppress the urge to cough      03/08/2016  Consultation/ [name] re:  Abnormal CT chest c/w bronchiectasis  Chief Complaint  Patient presents with  . Pulmonary Consult    Referred by Dr. KHulan Fess Pt last seen Oct 2014. He had sinus surgery in Jan 2017 and his cough has improved some since then. He has occ cough with yellow sputum.    Cough never completely resolved x 2012   is most prod p stirs in am / always yellow even p abx/ one episode of hemotpysis x 3 m prior to OV but this was in setting of epistaxis and ongoing nasal congestion that only improved transiently p sinus surgery rec Bronchiectasis  Pathophysiology  Whenever you develop cough congestion take mucinex or mucinex dm 1200 mg every 12 hours  Symbicort 80 Take 2 puffs first thing in am and then another 2 puffs about 12  hours later.  Work on inhaler technique:  Please schedule a follow up office visit in 6 weeks, call sooner if needed with pfts - ok to push back so we do both same day     04/25/2016  f/u ov/[name] re:  COPD GOLD II / MPNs with bronchiectasis  Chief Complaint  Patient presents with  . Follow-up    PFT's today. Cough has been about the same. No new co's today.  no better on symbicort / same cough still following up with ent on rx for sinus dz rec Bevespi Take 2 puffs first thing in am and then another 2 puffs about 12 hours later  trial >> if cough or breathing better continue if not stop For cough ok to use mucinex dm up to 1200 mg every 12 hours and the flutter valve as much as possible  Add: if truly refractory symptoms/ purulent sputum next step is FOB/ bal      10/09/2016  f/u ov/[name] re:   Bronchiectasis/ rhinitis / sinusitis  Chief Complaint  Patient presents with  . Follow-up    6 week follow up. States cough is better now. Denies any other symptoms  no longer waking up coughing Lots of runny nose but denies excess/ purulent  sputum or mucus plugs   Taking subtherapeutic doses of mucinex,dm on a maint/ not prn basis  Not limited by breathing from desired activities   rec For cough / congestion as needed can take up to 1200 mg of mucinex dm every 12 hours  Follow up with Dr Redmond Baseman and keep using the Nettipot  Try symbicort 80 Take 2 puffs first thing in am and then another 2 puffs about 12 hours later.  and if works >> continue it and, if not change to symbicort 160 Take 2 puffs first thing in am and then another 2 puffs about 12 hours later. X trial of one month Work on Engineer, technical sales technique:       07/25/2017  f/u ov/[name] re:  GOLD II/ bronchiectasis  Chief Complaint  Patient presents with  . Follow-up    Breathing "may be a little worse". He is coughing more, esp when he starts talking. Cough is prod during the day with pale yellow sputum, and then non prod at  night. He does not have a rescue inhaler.  Dyspnea:  Can walk 3.5 miles s stopping  Cough: worse with talking day > noct / rattling quality and can always force a cough with yellow mucus production during the day  Sleep: able to lie flat   abx make no difference nor does symbicort   No obvious day to day or daytime variability or assoc   mucus plugs or hemoptysis or cp or chest tightness, subjective wheeze or overt sinus or hb symptoms. No unusual exposure hx or h/o childhood pna/ asthma or knowledge of premature birth.  Sleeping ok flat without nocturnal  or early am exacerbation  of respiratory  c/o's or need for noct saba. Also denies any obvious fluctuation of symptoms with weather or environmental changes or other aggravating or alleviating factors except as outlined above   Current Allergies, Complete Past Medical History, Past Surgical History, Family History, and Social History were reviewed in [employer] record.  ROS  The following are not active complaints unless bolded Hoarseness, sore throat, dysphagia, dental problems, itching, sneezing,  nasal congestion or discharge of excess mucus or purulent secretions, ear ache,   fever, chills, sweats, unintended wt loss or wt gain, classically pleuritic or exertional cp,  orthopnea pnd or leg swelling, presyncope, palpitations, abdominal pain, anorexia, nausea, vomiting, diarrhea  or change in bowel habits or change in bladder habits, change in stools or change in urine, dysuria, hematuria,  rash, arthralgias, visual complaints, headache, numbness, weakness or ataxia or problems with walking or coordination,  change in mood/affect or memory.        Current Meds  Medication Sig  . amLODipine (NORVASC) 5 MG tablet Take 5 mg by mouth daily.   Marland Kitchen aspirin 81 MG tablet Take 81 mg by mouth daily.  . budesonide-formoterol (SYMBICORT) 160-4.5 MCG/ACT inhaler Inhale 2 puffs into the lungs 2 (two) times daily.  . Coenzyme Q10 (CO  Q 10 PO) Take 300 mg by mouth daily.   . DULoxetine (CYMBALTA) 30 MG capsule Take 60 mg by mouth daily. 30 MG in the morning  . famotidine (PEPCID) 20 MG tablet One at bedtime (Patient taking differently: Take 20 mg by mouth at bedtime. One at bedtime)  . gabapentin (NEURONTIN) 300 MG capsule Take 600 mg by mouth 2 (two) times daily.   Marland Kitchen losartan (COZAAR) 100 MG tablet Take 1 tablet by mouth daily.  . pravastatin (PRAVACHOL) 20 MG tablet Take 20  mg by mouth daily.   Marland Kitchen Respiratory Therapy Supplies (FLUTTER) DEVI Use as directed  . vitamin B-12 (CYANOCOBALAMIN) 1000 MCG tablet Take 1,000 mcg by mouth daily.  . [DISCONTINUED] dextromethorphan-guaiFENesin (MUCINEX DM) 30-600 MG 12hr tablet Take 1 tablet by mouth 2 (two) times daily as needed for cough.                       Objective:   Physical Exam   amb wm  nad   Vital signs reviewed - Note on arrival 02 sats  93%  on RA     03/08/2013       200 > 04/05/2013   194 >  12/2016  178 > 10/09/2016  174 > 01/22/2017   168  > 07/25/2017   177     01/22/13 199 lb 12.8 oz (90.629 kg)  01/08/13 197 lb (89.359 kg)       HEENT: nl dentition,  and oropharynx. Nl external ear canals without cough reflex- moderate bilateral non-specific turbinate edema         LUNGS: no acc muscle use,    Minimal insp and exp rhonchi bilaterally    HEENT: nl dentition,  . Nl external ear canals without cough reflex- moderate bilateral non-specific turbinate edema Copious mp secretions in oropharynx "that's from my lungs"      NECK :  without JVD/Nodes/TM/ nl carotid upstrokes bilaterally   LUNGS: no acc muscle use,  Nl contour chest  With insp and exp rhonchi bilaterally   CV:  RRR  no s3 or murmur or increase in P2, and no edema   ABD:  soft and nontender with nl inspiratory excursion in the supine position. No bruits or organomegaly appreciated, bowel sounds nl  MS:  Nl gait/ ext warm without deformities, calf tenderness, cyanosis or clubbing No  obvious joint restrictions   SKIN: warm and dry without lesions    NEURO:  alert, approp, nl sensorium with  no motor or cerebellar deficits apparent.       CXR PA and Lateral:   07/25/2017 :    I personally reviewed images and agree with radiology impression as follows:   Changes of COPD and scattered interstitial disease with chronic changes in the RIGHT mid lung and LEFT upper lobe. Small RIGHT pleural effusion with RIGHT basilar atelectasis, new        Assessment & Plan:

## 2017-07-25 NOTE — Patient Instructions (Addendum)
Add back protonix 40 mg Take 30-60 min before first meal of the day and continue pepcid 20 mg at times  Come to outpatient registration at [facility] (behind the ER) at 715 am Feb 6th Wednesday  with nothing to eat or drink after midnight Tuesday .for Bronchoscopy with lavage   Please remember to go to the  x-ray department downstairs in the basement  for your tests - we will call you with the results when they are available.

## 2017-07-27 ENCOUNTER — Encounter: Payer: Self-pay | Admitting: Internal Medicine

## 2017-07-27 NOTE — Assessment & Plan Note (Addendum)
-   Allergy profile  07/19/16  >  Eos 0.2/  IgE 6 neg RAST  - Alpha one Screen 07/19/16 m>  MM - CT 08/15/16 low dose  Compared to the prior study there has been a dramatic increased in extensive peribronchovascular nodularity throughout the mid to lower lungs bilaterally. This is technically considered Lung-RADS Category 4BS, suspicious, based off of size alone, however, the overall spectrum of findings is strongly favored to be infectious. - Sinus CT 09/04/2016 mucosal thickening in both maxillary sinuses. There is mucosal thickening within the frontal sinus, within the few remaining ethmoid air cells, and within the sphenoid sinus. No definite air-fluid level is Seen. - DgEs  02/28/17 neg  - CT chest 05/19/17 worse diffusely but esp RUL Ant segment  - FOB 07/30/17  Planned  Dr Janace Hoard does not feel any of the mp secretions are coming from above the cords so reasonable to do FOB/ bal RUL ant segment looking for MAI/ atypical organisms as abx "make no difference now"  Discussed in detail all the  indications, usual  risks and alternatives  relative to the benefits with patient who agrees to proceed with bronchoscopy with bal set for 07/31/17  I had an extended discussion with the patient reviewing all relevant studies completed to date and  lasting 15 to 20 minutes of a 25 minute visit    Each maintenance medication was reviewed in detail including most importantly the difference between maintenance and prns and under what circumstances the prns are to be triggered using an action plan format that is not reflected in the computer generated alphabetically organized AVS.    Please see AVS for specific instructions unique to this visit that I personally wrote and verbalized to the the pt in detail and then reviewed with pt  by my nurse highlighting any  changes in therapy recommended at today's visit to their plan of care.

## 2017-07-28 NOTE — Progress Notes (Signed)
Spoke with pt and notified of results per Dr. Wert. Pt verbalized understanding and denied any questions. 

## 2017-07-30 ENCOUNTER — Encounter (HOSPITAL_COMMUNITY)
Admission: RE | Disposition: A | Discharge status: Home / Self Care | Payer: Self-pay | Source: Ambulatory Visit | Attending: Internal Medicine

## 2017-07-30 ENCOUNTER — Encounter (HOSPITAL_COMMUNITY): Payer: Self-pay | Admitting: Respiratory Therapy

## 2017-07-30 ENCOUNTER — Ambulatory Visit (HOSPITAL_COMMUNITY)
Admission: RE | Admit: 2017-07-30 | Discharge: 2017-07-30 | Disposition: A | Discharge status: Home / Self Care | Payer: Medicare Other | Source: Ambulatory Visit | Attending: Internal Medicine | Admitting: Internal Medicine

## 2017-07-30 DIAGNOSIS — Z79899 Other long term (current) drug therapy: Secondary | ICD-10-CM | POA: Diagnosis not present

## 2017-07-30 DIAGNOSIS — Z7982 Long term (current) use of aspirin: Secondary | ICD-10-CM | POA: Diagnosis not present

## 2017-07-30 DIAGNOSIS — K219 Gastro-esophageal reflux disease without esophagitis: Secondary | ICD-10-CM | POA: Insufficient documentation

## 2017-07-30 DIAGNOSIS — Z87891 Personal history of nicotine dependence: Secondary | ICD-10-CM | POA: Diagnosis not present

## 2017-07-30 DIAGNOSIS — J479 Bronchiectasis, uncomplicated: Secondary | ICD-10-CM | POA: Diagnosis present

## 2017-07-30 DIAGNOSIS — R918 Other nonspecific abnormal finding of lung field: Secondary | ICD-10-CM | POA: Diagnosis not present

## 2017-07-30 DIAGNOSIS — R093 Abnormal sputum: Secondary | ICD-10-CM | POA: Insufficient documentation

## 2017-07-30 HISTORY — PX: VIDEO BRONCHOSCOPY: SHX5072

## 2017-07-30 SURGERY — VIDEO BRONCHOSCOPY WITHOUT FLUORO
Anesthesia: Moderate Sedation | Laterality: Bilateral

## 2017-07-30 MED ORDER — PHENYLEPHRINE HCL 0.25 % NA SOLN
NASAL | Status: DC | PRN
  Administered 2017-07-30: 2 via NASAL

## 2017-07-30 MED ORDER — MIDAZOLAM HCL 5 MG/ML IJ SOLN: 1.0000 mg | Freq: Once | INTRAMUSCULAR | Status: DC

## 2017-07-30 MED ORDER — LIDOCAINE HCL 2 % EX GEL: 1.0000 "application " | Freq: Once | CUTANEOUS | Status: DC

## 2017-07-30 MED ORDER — LIDOCAINE HCL 1 % IJ SOLN
INTRAMUSCULAR | Status: DC | PRN
  Administered 2017-07-30: 6 mL via RESPIRATORY_TRACT

## 2017-07-30 MED ORDER — LIDOCAINE HCL 2 % EX GEL
CUTANEOUS | Status: DC | PRN
  Administered 2017-07-30: 1

## 2017-07-30 MED ORDER — MEPERIDINE HCL 100 MG/ML IJ SOLN
INTRAMUSCULAR | Status: AC
  Filled 2017-07-30: qty 2

## 2017-07-30 MED ORDER — PHENYLEPHRINE HCL 0.25 % NA SOLN: 1.0000 | Freq: Four times a day (QID) | NASAL | Status: DC | PRN

## 2017-07-30 MED ORDER — MIDAZOLAM HCL 10 MG/2ML IJ SOLN
INTRAMUSCULAR | Status: DC | PRN
  Administered 2017-07-30 (×2): 2.5 mg via INTRAVENOUS

## 2017-07-30 MED ORDER — MEPERIDINE HCL 25 MG/ML IJ SOLN
INTRAMUSCULAR | Status: DC | PRN
  Administered 2017-07-30: 50 mg via INTRAVENOUS

## 2017-07-30 MED ORDER — MIDAZOLAM HCL 5 MG/ML IJ SOLN
INTRAMUSCULAR | Status: AC
  Filled 2017-07-30: qty 2

## 2017-07-30 MED ORDER — MEPERIDINE HCL 100 MG/ML IJ SOLN: 100.0000 mg | Freq: Once | INTRAMUSCULAR | Status: DC

## 2017-07-30 MED ORDER — SODIUM CHLORIDE 0.9 % IV SOLN
INTRAVENOUS | Status: DC
  Administered 2017-07-30: 09:00:00 via INTRAVENOUS

## 2017-07-30 NOTE — Progress Notes (Signed)
Video Bronchoscopy done Intervention Bronchial washing done Procedure tolerated well 

## 2017-07-30 NOTE — Discharge Instructions (Signed)
bFlexible Bronchoscopy, Care After These instructions give you information on caring for yourself after your procedure. Your doctor may also give you more specific instructions. Call your doctor if you have any problems or questions after your procedure. Follow these instructions at home:  Do not eat or drink anything for 2 hours after your procedure. If you try to eat or drink before the medicine wears off, food or drink could go into your lungs. You could also burn yourself.  After 2 hours have passed and when you can cough and gag normally, you may eat soft food and drink liquids slowly.  The day after the test, you may eat your normal diet.  You may do your normal activities.  Keep all doctor visits. Get help right away if:  You get more and more short of breath.  You get light-headed.  You feel like you are going to pass out (faint).  You have chest pain.  You have new problems that worry you.  You cough up more than a little blood.  You cough up more blood than before. This information is not intended to replace advice given to you by your health care provider. Make sure you discuss any questions you have with your health care provider. Document Released: 04/07/2009 Document Revised: 11/16/2015 Document Reviewed: 02/12/2013 Elsevier Interactive Patient Education  2017 [employer].  Nothing to eat or drink until 10:45  am today       07/30/2017  Any question or concerns please call the office at  [phone]

## 2017-07-30 NOTE — H&P (Signed)
Brief patient profile:  77 yowm quit smoking 2006 with chronic cough at that point which completely resolved p quit smoking although continued then and now to do wood working with onset new cough while on ACEi Dec 2012   Referred 01/08/2013 to pulmonary clinic by Dr Janace Hoard for cough with documented bronchiectasis on CT 01/17/2016    History of Present Illness  01/08/2013 1st pulmonary eval/Linnet Bottari stopped acei in April 2014 cc cough daily x 1.5 y w/in10 min of stirring and brings up tan mucus maybe a half a cup total  by lunch, seems worse sitting. Some sinus drainage better with allegra.  Does not remember abx or prednisone being used and w/u by Dr Janace Hoard was c/w chronic rinitis and ? gerd but  Not taking meds consistently. rec Prednisone 10 mg take  4 each am x 2 days,   2 each am x 2 days,  1 each am x 2 days and stop  Pantoprazole (protonix) 40 mg   Take 30-60 min before first meal of the day and Pepcid 20 mg one bedtime and chlortrimeton 16m at bedtime until return to office -  GERD diet. For cough use delsym or mucinex dm to suppress the urge to cough      03/08/2016  Consultation/ Apolinar Bero re:  Abnormal CT chest c/w bronchiectasis      Chief Complaint  Patient presents with  . Pulmonary Consult    Referred by Dr. KHulan Fess Pt last seen Oct 2014. He had sinus surgery in Jan 2017 and his cough has improved some since then. He has occ cough with yellow sputum.    Cough never completely resolved x 2012   is most prod p stirs in am / always yellow even p abx/ one episode of hemotpysis x 3 m prior to OV but this was in setting of epistaxis and ongoing nasal congestion that only improved transiently p sinus surgery rec Bronchiectasis  Pathophysiology  Whenever you develop cough congestion take mucinex or mucinex dm 1200 mg every 12 hours  Symbicort 80 Take 2 puffs first thing in am and then another 2 puffs about 12 hours later.  Work on inhaler technique:  Please schedule a follow up office  visit in 6 weeks, call sooner if needed with pfts - ok to push back so we do both same day     04/25/2016  f/u ov/Lace Chenevert re:  COPD GOLD II / MPNs with bronchiectasis      Chief Complaint  Patient presents with  . Follow-up    PFT's today. Cough has been about the same. No new co's today.  no better on symbicort / same cough still following up with ent on rx for sinus dz rec Bevespi Take 2 puffs first thing in am and then another 2 puffs about 12 hours later  trial >> if cough or breathing better continue if not stop For cough ok to use mucinex dm up to 1200 mg every 12 hours and the flutter valve as much as possible  Add: if truly refractory symptoms/ purulent sputum next step is FOB/ bal      10/09/2016  f/u ov/Judi Jaffe re:   Bronchiectasis/ rhinitis / sinusitis      Chief Complaint  Patient presents with  . Follow-up    6 week follow up. States cough is better now. Denies any other symptoms  no longer waking up coughing Lots of runny nose but denies excess/ purulent sputum or mucus plugs   Taking subtherapeutic doses  of mucinex,dm on a maint/ not prn basis  Not limited by breathing from desired activities   rec For cough / congestion as needed can take up to 1200 mg of mucinex dm every 12 hours  Follow up with Dr Redmond Baseman and keep using the Nettipot  Try symbicort 80 Take 2 puffs first thing in am and then another 2 puffs about 12 hours later.  and if works >> continue it and, if not change to symbicort 160 Take 2 puffs first thing in am and then another 2 puffs about 12 hours later. X trial of one month Work on Engineer, technical sales technique:       07/25/2017  f/u ov/Kristene Liberati re:  GOLD II/ bronchiectasis      Chief Complaint  Patient presents with  . Follow-up    Breathing "may be a little worse". He is coughing more, esp when he starts talking. Cough is prod during the day with pale yellow sputum, and then non prod at night. He does not have a rescue inhaler.  Dyspnea:   Can walk 3.5 miles s stopping  Cough: worse with talking day > noct / rattling quality and can always force a cough with yellow mucus production during the day  Sleep: able to lie flat   abx make no difference nor does symbicort   No obvious day to day or daytime variability or assoc   mucus plugs or hemoptysis or cp or chest tightness, subjective wheeze or overt sinus or hb symptoms. No unusual exposure hx or h/o childhood pna/ asthma or knowledge of premature birth.  Sleeping ok flat without nocturnal  or early am exacerbation  of respiratory  c/o's or need for noct saba. Also denies any obvious fluctuation of symptoms with weather or environmental changes or other aggravating or alleviating factors except as outlined above   Current Allergies, Complete Past Medical History, Past Surgical History, Family History, and Social History were reviewed in [employer] record.  ROS  The following are not active complaints unless bolded Hoarseness, sore throat, dysphagia, dental problems, itching, sneezing,  nasal congestion or discharge of excess mucus or purulent secretions, ear ache,   fever, chills, sweats, unintended wt loss or wt gain, classically pleuritic or exertional cp,  orthopnea pnd or leg swelling, presyncope, palpitations, abdominal pain, anorexia, nausea, vomiting, diarrhea  or change in bowel habits or change in bladder habits, change in stools or change in urine, dysuria, hematuria,  rash, arthralgias, visual complaints, headache, numbness, weakness or ataxia or problems with walking or coordination,  change in mood/affect or memory.            Current Meds  Medication Sig  . amLODipine (NORVASC) 5 MG tablet Take 5 mg by mouth daily.   Marland Kitchen aspirin 81 MG tablet Take 81 mg by mouth daily.  . budesonide-formoterol (SYMBICORT) 160-4.5 MCG/ACT inhaler Inhale 2 puffs into the lungs 2 (two) times daily.  . Coenzyme Q10 (CO Q 10 PO) Take 300 mg by mouth daily.   .  DULoxetine (CYMBALTA) 30 MG capsule Take 60 mg by mouth daily. 30 MG in the morning  . famotidine (PEPCID) 20 MG tablet One at bedtime (Patient taking differently: Take 20 mg by mouth at bedtime. One at bedtime)  . gabapentin (NEURONTIN) 300 MG capsule Take 600 mg by mouth 2 (two) times daily.   Marland Kitchen losartan (COZAAR) 100 MG tablet Take 1 tablet by mouth daily.  . pravastatin (PRAVACHOL) 20 MG tablet Take 20 mg  by mouth daily.   Marland Kitchen Respiratory Therapy Supplies (FLUTTER) DEVI Use as directed  . vitamin B-12 (CYANOCOBALAMIN) 1000 MCG tablet Take 1,000 mcg by mouth daily.  . [DISCONTINUED] dextromethorphan-guaiFENesin (MUCINEX DM) 30-600 MG 12hr tablet Take 1 tablet by mouth 2 (two) times daily as needed for cough.                       Objective:   Physical Exam   amb wm  nad   Vital signs reviewed - Note on arrival 02 sats  93%  on RA     03/08/2013       200 > 04/05/2013   194 >  12/2016  178 > 10/09/2016  174 > 01/22/2017   168  > 07/25/2017   177        01/22/13 199 lb 12.8 oz (90.629 kg)  01/08/13 197 lb (89.359 kg)       HEENT: nl dentition,  and oropharynx. Nl external ear canals without cough reflex- moderate bilateral non-specific turbinate edema         LUNGS: no acc muscle use,    Minimal insp and exp rhonchi bilaterally    HEENT: nl dentition,  . Nl external ear canals without cough reflex- moderate bilateral non-specific turbinate edema Copious mp secretions in oropharynx "that's from my lungs"      NECK :  without JVD/Nodes/TM/ nl carotid upstrokes bilaterally   LUNGS: no acc muscle use,  Nl contour chest  With insp and exp rhonchi bilaterally   CV:  RRR  no s3 or murmur or increase in P2, and no edema   ABD:  soft and nontender with nl inspiratory excursion in the supine position. No bruits or organomegaly appreciated, bowel sounds nl  MS:  Nl gait/ ext warm without deformities, calf tenderness, cyanosis or clubbing No obvious joint  restrictions   SKIN: warm and dry without lesions    NEURO:  alert, approp, nl sensorium with  no motor or cerebellar deficits apparent.       CXR PA and Lateral:   07/25/2017 :    I personally reviewed images and agree with radiology impression as follows:   Changes of COPD and scattered interstitial disease with chronic changes in the RIGHT mid lung and LEFT upper lobe. Small RIGHT pleural effusion with RIGHT basilar atelectasis, new        Assessment & Plan:      Assessment & Plan Note by Tanda Rockers, MD at 07/27/2017 5:38 AM   Author: Tanda Rockers, MD Author Type: Physician Filed: 07/27/2017 5:40 AM  Note Status: Bernell List: Cosign Not Required Encounter Date: 07/25/2017  Problem: Multiple pulmonary nodules asoc with Bronchiectasis   Editor: Tanda Rockers, MD (Physician)  Prior Versions: 1. Tanda Rockers, MD (Physician) at 07/27/2017 5:40 AM - Written    - Allergy profile  07/19/16  >  Eos 0.2/  IgE 6 neg RAST  - Alpha one Screen 07/19/16 m>  MM - CT 08/15/16 low dose  Compared to the prior study there has been a dramatic increased in extensive peribronchovascular nodularity throughout the mid to lower lungs bilaterally. This is technically considered Lung-RADS Category 4BS, suspicious, based off of size alone, however, the overall spectrum of findings is strongly favored to be infectious. - Sinus CT 09/04/2016 mucosal thickening in both maxillary sinuses. There is mucosal thickening within the frontal sinus, within the few remaining ethmoid air cells, and within the sphenoid sinus.  No definite air-fluid level is Seen. - DgEs  02/28/17 neg  - CT chest 05/19/17 worse diffusely but esp RUL Ant segment  - FOB 07/30/17  Planned  Dr Janace Hoard does not feel any of the mp secretions are coming from above the cords so reasonable to do FOB/ bal RUL ant segment looking for MAI/ atypical organisms as abx "make no difference now"  Discussed in detail all the  indications,  usual  risks and alternatives  relative to the benefits with patient who agrees to proceed with bronchoscopy with bal set for 07/31/17  I had an extended discussion with the patient reviewing all relevant studies completed to date and  lasting 15 to 20 minutes of a 25 minute visit    Each maintenance medication was reviewed in detail including most importantly the difference between maintenance and prns and under what circumstances the prns are to be triggered using an action plan format that is not reflected in the computer generated alphabetically organized AVS.    Please see AVS for specific instructions unique to this visit that I personally wrote and verbalized to the the pt in detail and then reviewed with pt  by my nurse highlighting any  changes in therapy recommended at today's visit to their plan of care.       Patient Instructions by Tanda Rockers, MD at 07/25/2017 2:00 PM   Author: Tanda Rockers, MD Author Type: Physician Filed: 07/25/2017 2:24 PM  Note Status: Addendum Cosign: Cosign Not Required Encounter Date: 07/25/2017  Editor: Tanda Rockers, MD (Physician)  Prior Versions: 1. Tanda Rockers, MD (Physician) at 07/25/2017 2:21 PM - Addendum   2. Tanda Rockers, MD (Physician) at 07/25/2017 2:19 PM - Signed    Add back protonix 40 mg Take 30-60 min before first meal of the day and continue pepcid 20 mg at times  Come to outpatient registration at [facility] (behind the ER) at 715 am Feb 6th Wednesday  with nothing to eat or drink after midnight Tuesday .for Bronchoscopy with lavage   Please remember to go to the  x-ray department downstairs in the basement  for your tests - we will call you with the results when they are available.           07/30/2017    Day of FOB - his birthday - no change in hx or exam.    Christinia Gully, MD Pulmonary and [place] [phone] After 5:30 PM or weekends, use Beeper [phone]

## 2017-07-30 NOTE — Op Note (Signed)
Bronchoscopy Procedure Note  Date of Operation: 07/30/2017   Pre-op Diagnosis: bronchiectasis/ refractory purulent sputum  Post-op Diagnosis: same   Surgeon: Christinia Gully  Anesthesia: Monitored Local Anesthesia with Sedation Time Started: 0840 given versed 5 mg IV and demerol 50 mg IV Time Stopped:  0855  Operation: Video Flexible fiberoptic bronchoscopy, diagnostic   Findings: copious loose mp secretions  Specimen: bal RUL  Estimated Blood Loss: none  Complications: none  Indications and History: See updated H and P same date. The risks, benefits, complications, treatment options and expected outcomes were discussed with the patient.  The possibilities of reaction to medication, pulmonary aspiration, perforation of a viscus, bleeding, failure to diagnose a condition and creating a complication requiring transfusion or operation were discussed with the patient who freely signed the consent.    Description of Procedure: The patient was re-examined in the bronchoscopy suite and the site of surgery properly noted/marked.  The patient was identified  and the procedure verified as Flexible Fiberoptic Bronchoscopy.  A Time Out was held and the above information confirmed.   After the induction of topical nasopharyngeal anesthesia, the patient was positioned  and the bronchoscope was passed through the R naris. The vocal cords were visualized and  1% buffered lidocaine 5 ml was topically placed onto the cords. The cords were nl. The scope was then passed into the trachea.  1% buffered lidocaine given topically. Airways inspected bilaterally to the subsegmental level with the following findings:  Copious MP secretions bilaterally with nl mucosa and no endobronchial lesions     Interventions:  BAL RUL      The Patient was taken to the Endoscopy Recovery area in satisfactory condition.  Attestation: I performed the procedure.  Christinia Gully, MD Pulmonary and [place] [phone] After 5:30 PM or weekends, call [phone]

## 2017-07-31 ENCOUNTER — Encounter (HOSPITAL_COMMUNITY): Payer: Self-pay | Admitting: Internal Medicine

## 2017-07-31 LAB — ACID FAST SMEAR (AFB, MYCOBACTERIA): Acid Fast Smear: NEGATIVE

## 2017-08-10 ENCOUNTER — Encounter: Payer: Self-pay | Admitting: Internal Medicine

## 2017-08-18 ENCOUNTER — Telehealth: Payer: Self-pay | Admitting: Internal Medicine

## 2017-08-18 NOTE — Telephone Encounter (Signed)
MW patient is requesting results from the bronch please advise on this, thanks.

## 2017-08-18 NOTE — Telephone Encounter (Signed)
The initial studies are all negative but the final cultures won't be out for weekds and I didn't turn it in for the routine culture as there was nothing green in the airway, only white which doesn't correlate with active bacterial infection at time of the procedure at least.   If he starts coughing up green again needs to come give [place] a sample for gm stain and culture and then ov w/in 72 h after that to regroup   When returns needs to bring all active meds with him

## 2017-08-18 NOTE — Telephone Encounter (Signed)
Spoke with patient. He is aware of MW's recs. He stated that he has not coughed up green mucus, it's still yellow. Patient advised that he would call us if the sputum ever turned green.   Nothing else needed at time of call.

## 2017-08-28 LAB — FUNGUS CULTURE WITH STAIN

## 2017-08-28 LAB — FUNGUS CULTURE RESULT

## 2017-08-28 LAB — FUNGAL ORGANISM REFLEX

## 2017-09-11 LAB — ACID FAST CULTURE WITH REFLEXED SENSITIVITIES (MYCOBACTERIA): Acid Fast Culture: NEGATIVE

## 2017-10-11 ENCOUNTER — Other Ambulatory Visit: Payer: Self-pay | Admitting: Internal Medicine

## 2018-01-20 IMAGING — CT CT PARANASAL SINUSES LIMITED
1 of 2 series · 8 of 11 positions shown, 10 images · non-contrast
Comparison: CT sinus images of 03/08/2013

CLINICAL DATA: Chronic cough for many years, possible sinusitis

EXAM:
CT PARANASAL SINUS LIMITED WITHOUT CONTRAST
TECHNIQUE: Non-contiguous multidetector CT images of the paranasal sinuses were
obtained in a single plane without contrast.

[Series 4: limited sinus st · axial · 0.24mm/px · z∈[+1298,+1368]mm · 8 of 10 slices shown, 10 images]
[im 2/10  brain]
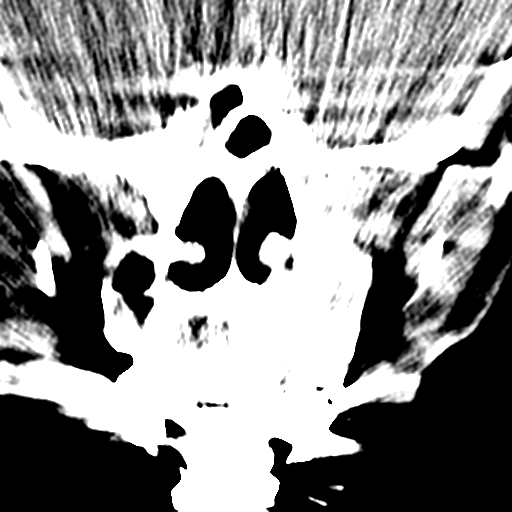
[im 2/10  bone]
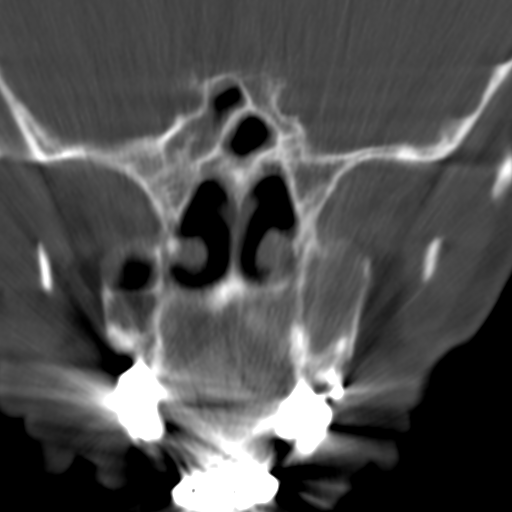
[im 3/10  bone]
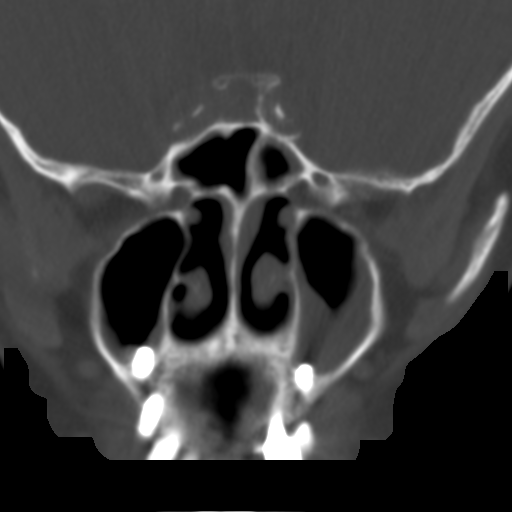
[im 4/10  bone]
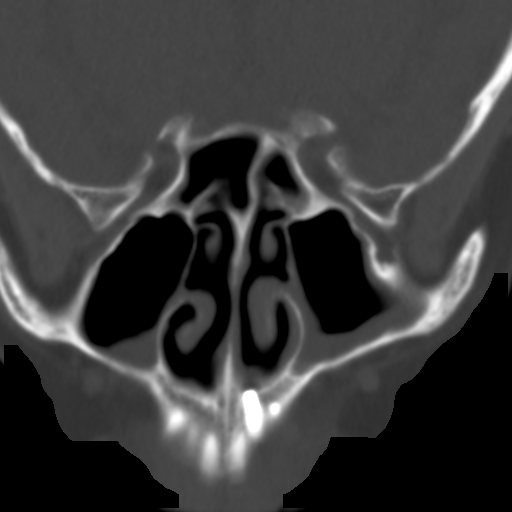
[im 5/10  bone]
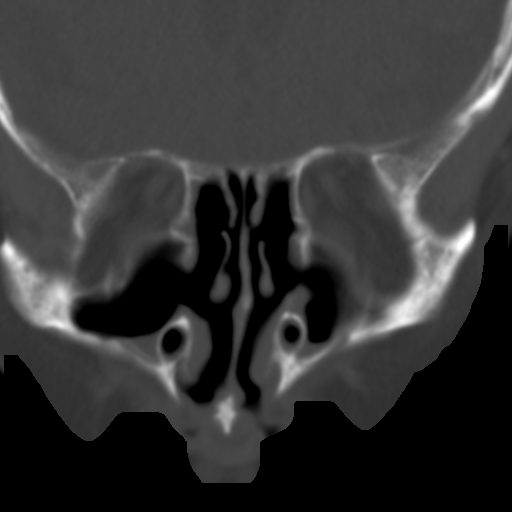
[im 6/10  brain]
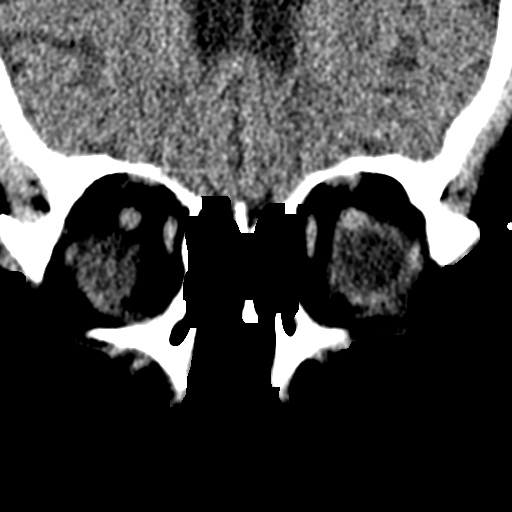
[im 6/10  bone]
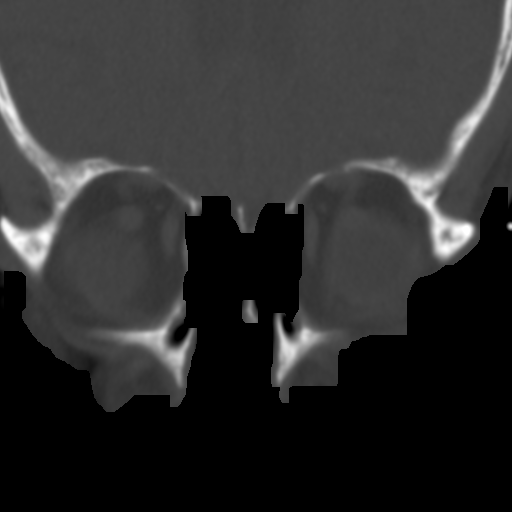
[im 7/10  bone]
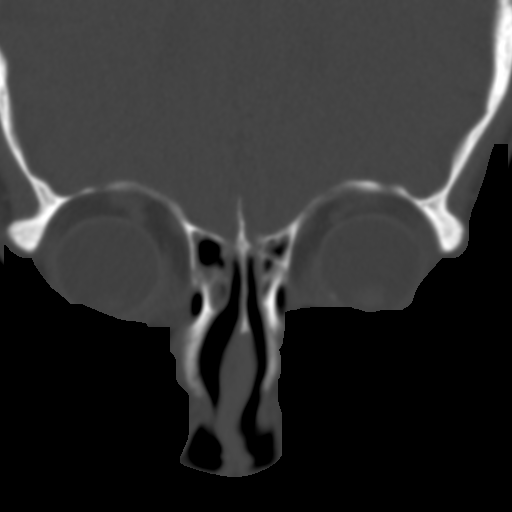
[im 8/10  bone]
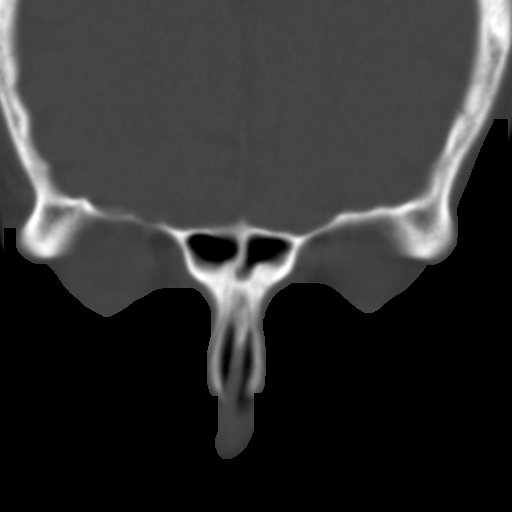
[im 9/10  bone]
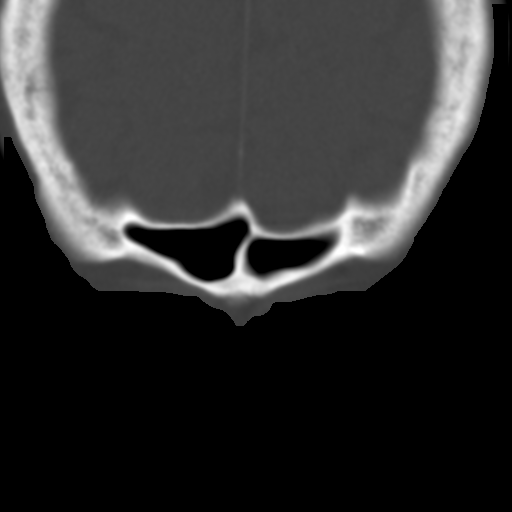

[8 of 11 positions shown; findings below may reference images not displayed]

FINDINGS: On the limited views obtained, there are changes of bilateral medial
antrostomies and partial ethmoidectomies. However there is mucosal
thickening in both maxillary sinuses. There is mucosal thickening
within the frontal sinus, within the few remaining ethmoid air
cells, and within the sphenoid sinus. No definite air-fluid level is
seen. The nasal septum appears normally positioned. The nasal
turbinates are in normal position and the nasal airway is patent.
The olfactory recesses are pneumatized. No bony abnormality is seen.
The intracranial portion of the study shows no significant
abnormality and no soft tissue abnormality is evident.
IMPRESSION: 1. Diffuse paranasal sinus disease.
2. Changes of prior bilateral medial antrostomies and partial
ethmoidectomies.

## 2018-06-29 ENCOUNTER — Ambulatory Visit (INDEPENDENT_AMBULATORY_CARE_PROVIDER_SITE_OTHER): Payer: Medicare Other | Admitting: Internal Medicine

## 2018-06-29 ENCOUNTER — Encounter: Payer: Self-pay | Admitting: Internal Medicine

## 2018-06-29 ENCOUNTER — Ambulatory Visit (INDEPENDENT_AMBULATORY_CARE_PROVIDER_SITE_OTHER)
Admission: RE | Admit: 2018-06-29 | Discharge: 2018-06-29 | Disposition: A | Discharge status: Home / Self Care | Payer: Medicare Other | Source: Ambulatory Visit | Attending: Internal Medicine | Admitting: Internal Medicine

## 2018-06-29 VITALS — BP 164/62 | HR 82 | Ht 68.0 in | Wt 170.0 lb

## 2018-06-29 DIAGNOSIS — R918 Other nonspecific abnormal finding of lung field: Secondary | ICD-10-CM

## 2018-06-29 DIAGNOSIS — J449 Chronic obstructive pulmonary disease, unspecified: Secondary | ICD-10-CM | POA: Diagnosis not present

## 2018-06-29 MED ORDER — BUDESONIDE-FORMOTEROL FUMARATE 80-4.5 MCG/ACT IN AERO: 2.0000 | INHALATION_SPRAY | Freq: Two times a day (BID) | RESPIRATORY_TRACT | 0 refills | Status: DC

## 2018-06-29 MED ORDER — BUDESONIDE-FORMOTEROL FUMARATE 80-4.5 MCG/ACT IN AERO: 2.0000 | INHALATION_SPRAY | Freq: Two times a day (BID) | RESPIRATORY_TRACT | 11 refills | Status: DC

## 2018-06-29 NOTE — Patient Instructions (Signed)
No change in medications  Please see patient coordinator before you leave today  to schedule refer to [facility]    Please remember to go to the  x-ray department    for your tests - we will call you with the results when they are available     If you are satisfied with your treatment plan,  let your doctor know and he/she can either refill your medications or you can return here when your prescription runs out.     If in any way you are not 100% satisfied,  please tell us.  If 100% better, tell your friends!  Pulmonary follow up here  is as needed

## 2018-06-29 NOTE — Progress Notes (Signed)
Subjective:    Patient ID: [name], male    DOB: 12-09-1940  MRN: [id number]    Brief patient profile:  86 yowm quit smoking 2006 with chronic cough at that point which completely resolved p quit smoking although continued then and now to do wood working with onset new cough while on ACEi Dec 2012   Referred 01/08/2013 to pulmonary clinic by Dr Janace Hoard for cough with documented bronchiectasis on CT 01/17/2016      History of Present Illness  01/08/2013 1st pulmonary eval/[name] stopped acei in April 2014 cc cough daily x 1.5 y w/in10 min of stirring and brings up tan mucus maybe a half a cup total  by lunch, seems worse sitting. Some sinus drainage better with allegra.  Does not remember abx or prednisone being used and w/u by Dr Janace Hoard was c/w chronic rinitis and ? gerd but  Not taking meds consistently. rec Prednisone 10 mg take  4 each am x 2 days,   2 each am x 2 days,  1 each am x 2 days and stop  Pantoprazole (protonix) 40 mg   Take 30-60 min before first meal of the day and Pepcid 20 mg one bedtime and chlortrimeton 88m at bedtime until return to office -  GERD diet. For cough use delsym or mucinex dm to suppress the urge to cough      03/08/2016  Consultation/ [name] re:  Abnormal CT chest c/w bronchiectasis  Chief Complaint  Patient presents with  . Pulmonary Consult    Referred by Dr. KHulan Fess Pt last seen Oct 2014. He had sinus surgery in Jan 2017 and his cough has improved some since then. He has occ cough with yellow sputum.    Cough never completely resolved x 2012   is most prod p stirs in am / always yellow even p abx/ one episode of hemotpysis x 3 m prior to OV but this was in setting of epistaxis and ongoing nasal congestion that only improved transiently p sinus surgery rec Bronchiectasis  Pathophysiology  Whenever you develop cough congestion take mucinex or mucinex dm 1200 mg every 12 hours  Symbicort 80 Take 2 puffs first thing in am and then another 2 puffs about  12 hours later.  Work on inhaler technique:  Please schedule a follow up office visit in 6 weeks, call sooner if needed with pfts - ok to push back so we do both same day     04/25/2016  f/u ov/[name] re:  COPD GOLD II / MPNs with bronchiectasis  Chief Complaint  Patient presents with  . Follow-up    PFT's today. Cough has been about the same. No new co's today.  no better on symbicort / same cough still following up with ent on rx for sinus dz rec Bevespi Take 2 puffs first thing in am and then another 2 puffs about 12 hours later  trial >> if cough or breathing better continue if not stop For cough ok to use mucinex dm up to 1200 mg every 12 hours and the flutter valve as much as possible  Add: if truly refractory symptoms/ purulent sputum next step is FOB/ bal      10/09/2016  f/u ov/[name] re:   Bronchiectasis/ rhinitis / sinusitis  Chief Complaint  Patient presents with  . Follow-up    6 week follow up. States cough is better now. Denies any other symptoms  no longer waking up coughing Lots of runny nose but denies  excess/ purulent sputum or mucus plugs   Taking subtherapeutic doses of mucinex,dm on a maint/ not prn basis  Not limited by breathing from desired activities   rec For cough / congestion as needed can take up to 1200 mg of mucinex dm every 12 hours  Follow up with Dr Redmond Baseman and keep using the Nettipot  Try symbicort 80 Take 2 puffs first thing in am and then another 2 puffs about 12 hours later.  and if works >> continue it and, if not change to symbicort 160 Take 2 puffs first thing in am and then another 2 puffs about 12 hours later. X trial of one month Work on Engineer, technical sales technique:       07/25/2017  f/u ov/[name] re:  GOLD II/ bronchiectasis  Chief Complaint  Patient presents with  . Follow-up    Breathing "may be a little worse". He is coughing more, esp when he starts talking. Cough is prod during the day with pale yellow sputum, and then non prod at  night. He does not have a rescue inhaler.  Dyspnea:  Can walk 3.5 miles s stopping  Cough: worse with talking day > noct / rattling quality and can always force a cough with yellow mucus production during the day  Sleep: able to lie flat  rec Add back protonix 40 mg Take 30-60 min before first meal of the day and continue pepcid 20 mg at times Come to outpatient registration at [facility] (behind the ER) at 715 am Feb 6th  > copious secretions, neg afb/ fungal cultures     06/29/2018  f/u ov/[name] re:  GOLD II / bronchiectasis  Chief Complaint  Patient presents with  . Follow-up    Pt states he had PNA and flu approx 2 wks ago.  He has some cough with clear to light yellow sputum.  His breathing is about the same since his last visit.   Dyspnea:  Still able to walk up to 3.5 miles per day Cough: congested in am / minimally discolored mucus, does not know what he was given  Sleeping: able to lie flat ok/  SABA use:  pillows 02: none   No obvious day to day or daytime variability or assoc excess/ purulent sputum or mucus plugs or hemoptysis or cp or chest tightness, subjective wheeze or overt sinus or hb symptoms.   Sleeping fine now  without nocturnal    exacerbation  of respiratory  c/o's or need for noct saba. Also denies any obvious fluctuation of symptoms with weather or environmental changes or other aggravating or alleviating factors except as outlined above   No unusual exposure hx or h/o childhood pna/ asthma or knowledge of premature birth.  Current Allergies, Complete Past Medical History, Past Surgical History, Family History, and Social History were reviewed in [employer] record.  ROS  The following are not active complaints unless bolded Hoarseness, sore throat, dysphagia, dental problems, itching, sneezing,  nasal congestion or discharge of excess mucus or purulent secretions, ear ache,   fever, chills, sweats, unintended wt loss or wt gain,  classically pleuritic or exertional cp,  orthopnea pnd or arm/hand swelling  or leg swelling, presyncope, palpitations, abdominal pain, anorexia, nausea, vomiting, diarrhea  or change in bowel habits or change in bladder habits, change in stools or change in urine, dysuria, hematuria,  rash, arthralgias, visual complaints, headache, numbness, weakness or ataxia or problems with walking or coordination,  change in mood or  memory.        Current Meds  Medication Sig  . amLODipine (NORVASC) 5 MG tablet Take 5 mg by mouth daily.   . Cholecalciferol (VITAMIN D) 2000 units tablet Take 2,000 Units by mouth daily.  . Coenzyme Q10 (CO Q 10 PO) Take 300 mg by mouth daily.   . DULoxetine (CYMBALTA) 30 MG capsule Take 60 mg by mouth daily. 30 MG in the morning  . gabapentin (NEURONTIN) 300 MG capsule Take 600 mg by mouth at bedtime.   Marland Kitchen losartan (COZAAR) 100 MG tablet Take 1 tablet by mouth daily.  . pravastatin (PRAVACHOL) 20 MG tablet Take 20 mg by mouth daily.   Marland Kitchen Respiratory Therapy Supplies (FLUTTER) DEVI Use as directed  . SYMBICORT 160-4.5 MCG/ACT inhaler INHALE 2 PUFFS THE FIRST THING IN THE MORNING AND THEN ANOTHER 2 PUFFS ABOUT 12 HOURS LATER  . vitamin B-12 (CYANOCOBALAMIN) 1000 MCG tablet Take 1,000 mcg by mouth daily.             Objective:   Physical Exam   amb wm  nad   Vital signs reviewed - Note on arrival 02 sats  95% on RA    03/08/2013       200 > 04/05/2013   194 >  12/2016  178 > 10/09/2016  174 > 01/22/2017   168  > 07/25/2017   177 > 06/29/2018  170     01/22/13 199 lb 12.8 oz (90.629 kg)  01/08/13 197 lb (89.359 kg)      HEENT: nl dentition / oropharynx. Nl external ear canals without cough reflex -  Mild bilateral non-specific turbinate edema     NECK :  without JVD/Nodes/TM/ nl carotid upstrokes bilaterally   LUNGS: no acc muscle use,  Mild/mod  barrel  contour chest wall with  Distant  insp/exp rhonchi and mild/mod  Hyperresonant  to  percussion bilaterally     CV:   RRR  no s3 or murmur or increase in P2, and no edema   ABD:  soft and nontender with pos mid  insp Hoover's  in the supine position. No bruits or organomegaly appreciated, bowel sounds nl  MS:   Nl gait/  ext warm without deformities, calf tenderness, cyanosis or clubbing No obvious joint restrictions   SKIN: warm and dry without lesions    NEURO:  alert, approp, nl sensorium with  no motor or cerebellar deficits apparent.          CXR PA and Lateral:   06/29/2018 :    I personally reviewed images and agree with radiology impression as follows:    New right upper lobe airspace opacities superimposed upon chronic disease towards the lung bases.     Assessment & Plan:

## 2018-06-30 ENCOUNTER — Encounter: Payer: Self-pay | Admitting: Internal Medicine

## 2018-06-30 NOTE — Assessment & Plan Note (Addendum)
Quit smoking 2006  - 03/08/2016    90% > try symbicort 80 2bid and return for pfts  - PFT's  04/25/2016  FEV1 1.94 (67 % ) ratio 65   p 4 % improvement from saba p nothing prior to study with DLCO  67/66 % corrects to 76  % for alv volume   - 04/25/2016    try bevespi samples> no better so d/c'd - Alpha one AT 07/19/16 >  MM - Allergy profile 07/19/16  >  Eos 0.2 /  IgE 6 with neg RAST        Well compensated on present rx = symb 160 2bid    - The proper method of use, as well as anticipated side effects, of a metered-dose inhaler are discussed and demonstrated to the patient.     Plans f/u at [facility] as moving to [place]   I had an extended discussion with the patient reviewing all relevant studies completed to date and  lasting 15 to 20 minutes of a 25 minute visit    See device teaching which extended face to face time for this visit.  Each maintenance medication was reviewed in detail including emphasizing most importantly the difference between maintenance and prns and under what circumstances the prns are to be triggered using an action plan format that is not reflected in the computer generated alphabetically organized AVS which I have not found useful in most complex patients, especially with respiratory illnesses  Please see AVS for specific instructions unique to this visit that I personally wrote and verbalized to the the pt in detail and then reviewed with pt  by my nurse highlighting any  changes in therapy recommended at today's visit to their plan of care.

## 2018-06-30 NOTE — Assessment & Plan Note (Addendum)
CT chest 01/17/16  (low dose screening) Emphysema with innumerable peripheral tiny pulmonary nodules scattered mainly in a peribronchovascular distribution and associated with bronchial wall thickening, bronchiectasis, and airway impaction. These changes are associated with areas of irregular airspace consolidation mainly in the dependent lower lobes bilaterally - 03/08/2016    90% > try symbicort 80 2bid and return for pfts  - PFT's  04/25/2016  FEV1 1.94 (67 % ) ratio 65   p 4 % improvement from saba p nothing prior to study with DLCO  67/66 % corrects to 76  % for alv volume   - Flutter Valve rx  04/25/16  - 06/07/2016 augmentin bid x 20 days> no better as of 07/19/2016  - Allergy profile  07/19/16  >  Eos 0.2/  IgE 6 neg RAST  - Alpha one Screen 07/19/16  >  MM - CT 08/15/16 low dose  Compared to the prior study there has been a dramatic increased in extensive peribronchovascular nodularity throughout the mid to lower lungs bilaterally. This is technically considered Lung-RADS Category 4BS, suspicious, based off of size alone, however, the overall spectrum of findings is strongly favored to be infectious. - Sinus CT 09/04/2016 mucosal thickening in both maxillary sinuses. There is mucosal thickening within the frontal sinus, within the few remaining ethmoid air cells, and within the sphenoid sinus. No definite air-fluid level is Seen. - DgEs  02/28/17 neg  - CT chest 05/19/17 worse diffusely but esp RUL Ant segment  - FOB 07/30/17 > copious secretions,  neg afb/fungus stain/ cultures   S/p flare of bronchiectasis vs HCAP rx by UC ? rx but clinically back to baseline and will need a  cxr in about a month either here or at [facility] depending when makes the move to [place] and establishes there  No additonal abx needed at his point   Discussed in detail all the  indications, usual  risks and alternatives  relative to the benefits with patient who agrees to proceed with conservative f/u as outlined

## 2018-06-30 NOTE — Progress Notes (Signed)
Spoke with pt and notified of results per Dr. Wert. Pt verbalized understanding and denied any questions. 

## 2018-07-06 ENCOUNTER — Ambulatory Visit: Admit: 2018-07-06 | Discharge: 2018-07-07 | Payer: MEDICARE

## 2018-07-06 DIAGNOSIS — R911 Solitary pulmonary nodule: Principal | ICD-10-CM

## 2018-07-28 ENCOUNTER — Ambulatory Visit
Admit: 2018-07-28 | Discharge: 2018-07-29 | Payer: MEDICARE | Attending: Critical Care Medicine | Primary: Critical Care Medicine

## 2018-07-28 DIAGNOSIS — J479 Bronchiectasis, uncomplicated: Secondary | ICD-10-CM

## 2018-07-28 DIAGNOSIS — R918 Other nonspecific abnormal finding of lung field: Principal | ICD-10-CM

## 2018-07-28 MED ORDER — ALBUTEROL SULFATE 2.5 MG/3 ML (0.083 %) SOLUTION FOR NEBULIZATION
RESPIRATORY_TRACT | 2 refills | 0.00000 days | Status: CP | PRN
Start: 2018-07-28 — End: 2018-11-10

## 2018-07-28 MED ORDER — SODIUM CHLORIDE 3 % FOR NEBULIZATION
Freq: Two times a day (BID) | RESPIRATORY_TRACT | 11 refills | 0 days | Status: CP
Start: 2018-07-28 — End: 2018-08-27

## 2018-07-31 ENCOUNTER — Ambulatory Visit: Payer: Medicare Other | Admitting: Internal Medicine

## 2018-08-04 ENCOUNTER — Telehealth: Payer: Self-pay | Admitting: Internal Medicine

## 2018-08-04 DIAGNOSIS — J449 Chronic obstructive pulmonary disease, unspecified: Secondary | ICD-10-CM

## 2018-08-04 NOTE — Telephone Encounter (Signed)
lmom 

## 2018-08-04 NOTE — Telephone Encounter (Signed)
Fine with me dx copd

## 2018-08-04 NOTE — Telephone Encounter (Signed)
Spoke with pt, requesting an order for a nebulizer machine.  Pt was seen by [facility] and was given a rx for albuterol neb but was not prescribed a neb machine.    MW please advise if ok to order a nebulizer machine for pt.  Thanks!

## 2018-08-04 NOTE — Telephone Encounter (Signed)
Pt returning call CB# [phone]

## 2018-08-04 NOTE — Telephone Encounter (Signed)
Spoke with pt's wife Remo Lipps (dpr on file), aware of order.  Order has been placed.  Nothing further needed.

## 2018-11-10 ENCOUNTER — Institutional Professional Consult (permissible substitution): Admit: 2018-11-10 | Discharge: 2018-11-11 | Payer: MEDICARE | Attending: Internal Medicine | Primary: Internal Medicine

## 2018-11-10 ENCOUNTER — Ambulatory Visit: Admit: 2018-11-10 | Discharge: 2018-11-11 | Payer: MEDICARE

## 2018-11-10 DIAGNOSIS — J479 Bronchiectasis, uncomplicated: Principal | ICD-10-CM

## 2018-11-10 MED ORDER — AEROBIKA OSCILLATING PEP SYSTEM DEVICE
0 refills | 0 days | Status: CP
Start: 2018-11-10 — End: ?

## 2018-11-10 MED ORDER — SODIUM CHLORIDE 3 % FOR NEBULIZATION
Freq: Two times a day (BID) | RESPIRATORY_TRACT | 3 refills | 0 days | Status: CP
Start: 2018-11-10 — End: 2019-11-10
  Filled 2018-11-11: qty 720, 90d supply, fill #0

## 2018-11-10 MED ORDER — ALBUTEROL SULFATE 2.5 MG/3 ML (0.083 %) SOLUTION FOR NEBULIZATION
Freq: Two times a day (BID) | RESPIRATORY_TRACT | 3 refills | 0.00000 days | Status: CP
Start: 2018-11-10 — End: 2019-11-10

## 2018-11-10 MED ORDER — ALBUTEROL SULFATE HFA 90 MCG/ACTUATION AEROSOL INHALER
RESPIRATORY_TRACT | 11 refills | 0.00000 days | Status: CP | PRN
Start: 2018-11-10 — End: ?
  Filled 2018-11-11: qty 25.5, 50d supply, fill #0
  Filled 2018-11-11: qty 540, 90d supply, fill #0

## 2018-11-10 MED ORDER — NEBULIZERS
3 refills | 0 days
Start: 2018-11-10 — End: ?

## 2018-11-10 MED ORDER — NEBULIZER ACCESSORIES KIT
PACK | 3 refills | 0 days | Status: CP
Start: 2018-11-10 — End: ?

## 2018-11-10 MED ORDER — OPTICHAMBER DIAMOND VHC SPACER
0 refills | 0 days | Status: CP
Start: 2018-11-10 — End: ?
  Filled 2018-11-11: qty 1, 30d supply, fill #0

## 2018-11-11 MED FILL — ALBUTEROL SULFATE HFA 90 MCG/ACTUATION AEROSOL INHALER: 50 days supply | Qty: 26 | Fill #0 | Status: AC

## 2018-11-11 MED FILL — ALBUTEROL SULFATE 2.5 MG/3 ML (0.083 %) SOLUTION FOR NEBULIZATION: 90 days supply | Qty: 540 | Fill #0 | Status: AC

## 2018-11-11 MED FILL — OPTICHAMBER DIAMOND VHC SPACER: 30 days supply | Qty: 1 | Fill #0 | Status: AC

## 2018-11-11 MED FILL — SODIUM CHLORIDE 3 % FOR NEBULIZATION: 90 days supply | Qty: 720 | Fill #0 | Status: AC

## 2018-11-11 MED FILL — LC PLUS MISC: 30 days supply | Qty: 3 | Fill #0

## 2018-11-11 MED FILL — LC PLUS MISC: 30 days supply | Qty: 3 | Fill #0 | Status: AC

## 2018-11-12 ENCOUNTER — Other Ambulatory Visit: Payer: Self-pay | Admitting: Internal Medicine

## 2019-01-15 ENCOUNTER — Institutional Professional Consult (permissible substitution): Admit: 2019-01-15 | Discharge: 2019-01-16 | Payer: MEDICARE | Attending: Internal Medicine | Primary: Internal Medicine

## 2019-01-15 DIAGNOSIS — J479 Bronchiectasis, uncomplicated: Principal | ICD-10-CM

## 2019-01-15 DIAGNOSIS — J988 Other specified respiratory disorders: Secondary | ICD-10-CM

## 2019-01-15 DIAGNOSIS — A498 Other bacterial infections of unspecified site: Secondary | ICD-10-CM

## 2019-01-17 MED ORDER — TOBRAMYCIN 300 MG/5 ML IN 0.225 % SODIUM CHLORIDE FOR NEBULIZATION
Freq: Two times a day (BID) | RESPIRATORY_TRACT | 0 refills | 28 days | Status: CP
Start: 2019-01-17 — End: 2019-02-14

## 2019-01-18 NOTE — Unmapped (Signed)
[facility] Specialty Medication Referral: No PA required    Medication (Brand/Generic): TOBRAMYCIN    Initial Benefits Investigation Claim completed with resulted information below:  No PA required  Patient ABLE to fill at [facility]  Insurance Company:  [employer] MEDICARE PART D AND [employer]  Anticipated Copay: $0    NOTE ON PRESCRIPTION: Please run for cost and if needs PA. Not filling currently.    As Co-pay is under $25 defined limit, per policy there will be no further investigation of need for financial assistance at this time unless patient requests. This referral has been communicated to the provider and handed off to the [facility] team for further processing and filling of prescribed medication.   ______________________________________________________________________  Please utilize this referral for viewing purposes as it will serve as the central location for all relevant documentation and updates.

## 2019-03-01 DIAGNOSIS — J479 Bronchiectasis, uncomplicated: Secondary | ICD-10-CM

## 2019-03-15 ENCOUNTER — Other Ambulatory Visit: Payer: Self-pay

## 2019-03-29 DIAGNOSIS — J479 Bronchiectasis, uncomplicated: Secondary | ICD-10-CM

## 2019-04-26 DIAGNOSIS — J479 Bronchiectasis, uncomplicated: Principal | ICD-10-CM

## 2019-05-17 NOTE — Unmapped (Signed)
This patient has been disenrolled from the [facility] specialty pharmacy services due to patient never started on medication.  It has been over 4 months. We will re-enroll if patient starts Tobi .    Julianne Rice  [facility] Specialty Pharmacist

## 2019-06-01 MED FILL — ALBUTEROL SULFATE 2.5 MG/3 ML (0.083 %) SOLUTION FOR NEBULIZATION: RESPIRATORY_TRACT | 90 days supply | Qty: 540 | Fill #1

## 2019-06-01 MED FILL — ALBUTEROL SULFATE 2.5 MG/3 ML (0.083 %) SOLUTION FOR NEBULIZATION: 90 days supply | Qty: 540 | Fill #1 | Status: AC

## 2019-06-01 MED FILL — SODIUM CHLORIDE 3 % FOR NEBULIZATION: RESPIRATORY_TRACT | 90 days supply | Qty: 720 | Fill #1

## 2019-06-01 MED FILL — SODIUM CHLORIDE 3 % FOR NEBULIZATION: 90 days supply | Qty: 720 | Fill #1 | Status: AC

## 2019-06-21 DIAGNOSIS — J479 Bronchiectasis, uncomplicated: Principal | ICD-10-CM

## 2019-07-07 ENCOUNTER — Ambulatory Visit: Payer: Medicare Other | Attending: Internal Medicine

## 2019-07-07 DIAGNOSIS — Z23 Encounter for immunization: Secondary | ICD-10-CM | POA: Insufficient documentation

## 2019-07-07 NOTE — Progress Notes (Signed)
   Covid-19 Vaccination Clinic  Name:  [name]    MRN: [id number] DOB: 18-Jun-1941  07/07/2019  [name] was observed post Covid-19 immunization for 15 minutes without incidence. He was provided with Vaccine Information Sheet and instruction to access the V-Safe system.   [name] was instructed to call 911 with any severe reactions post vaccine: Marland Kitchen Difficulty breathing  . Swelling of your face and throat  . A fast heartbeat  . A bad rash all over your body  . Dizziness and weakness    Immunizations Administered    Name Date Dose VIS Date Route   Pfizer COVID-19 Vaccine 07/07/2019 11:26 AM 0.3 mL 06/04/2019 Intramuscular   Manufacturer: [place]   Lot: [id number]   [place]: [id number]

## 2019-07-19 DIAGNOSIS — J479 Bronchiectasis, uncomplicated: Principal | ICD-10-CM

## 2019-07-28 ENCOUNTER — Ambulatory Visit: Payer: Medicare PPO | Attending: Internal Medicine

## 2019-07-28 DIAGNOSIS — Z23 Encounter for immunization: Secondary | ICD-10-CM

## 2019-07-28 NOTE — Progress Notes (Signed)
   Covid-19 Vaccination Clinic  Name:  MALI JUAIRE    MRN: [id number] DOB: 05/01/1941  07/28/2019  Mr. Marder was observed post Covid-19 immunization for 15 minutes without incidence. He was provided with Vaccine Information Sheet and instruction to access the V-Safe system.   Mr. Valerio was instructed to call 911 with any severe reactions post vaccine: Marland Kitchen Difficulty breathing  . Swelling of your face and throat  . A fast heartbeat  . A bad rash all over your body  . Dizziness and weakness    Immunizations Administered    Name Date Dose VIS Date Route   Pfizer COVID-19 Vaccine 07/28/2019 11:10 AM 0.3 mL 06/04/2019 Intramuscular   Manufacturer: [place]   Lot: [id number]   [place]: [id number]

## 2019-08-16 DIAGNOSIS — J479 Bronchiectasis, uncomplicated: Principal | ICD-10-CM

## 2019-08-31 ENCOUNTER — Ambulatory Visit: Admit: 2019-08-31 | Discharge: 2019-09-01 | Payer: MEDICARE | Attending: Internal Medicine | Primary: Internal Medicine

## 2019-08-31 DIAGNOSIS — J988 Other specified respiratory disorders: Secondary | ICD-10-CM

## 2019-08-31 DIAGNOSIS — J479 Bronchiectasis, uncomplicated: Principal | ICD-10-CM

## 2019-08-31 DIAGNOSIS — A498 Other bacterial infections of unspecified site: Principal | ICD-10-CM

## 2019-08-31 DIAGNOSIS — J449 Chronic obstructive pulmonary disease, unspecified: Principal | ICD-10-CM

## 2019-08-31 DIAGNOSIS — R918 Other nonspecific abnormal finding of lung field: Principal | ICD-10-CM

## 2019-08-31 MED ORDER — ALBUTEROL SULFATE HFA 90 MCG/ACTUATION AEROSOL INHALER: 2 | g | 11 refills | 50 days | Status: AC

## 2019-08-31 MED ORDER — ALBUTEROL SULFATE HFA 90 MCG/ACTUATION AEROSOL INHALER
RESPIRATORY_TRACT | 11 refills | 50.00000 days | Status: CP | PRN
Start: 2019-08-31 — End: 2019-08-31
  Filled 2019-09-15: qty 25.5, 50d supply, fill #0

## 2019-09-13 DIAGNOSIS — J479 Bronchiectasis, uncomplicated: Principal | ICD-10-CM

## 2019-09-15 MED FILL — ALBUTEROL SULFATE HFA 90 MCG/ACTUATION AEROSOL INHALER: 50 days supply | Qty: 26 | Fill #0 | Status: AC

## 2019-09-27 DIAGNOSIS — A498 Other bacterial infections of unspecified site: Principal | ICD-10-CM

## 2019-09-27 DIAGNOSIS — J988 Other specified respiratory disorders: Principal | ICD-10-CM

## 2019-09-27 DIAGNOSIS — J479 Bronchiectasis, uncomplicated: Principal | ICD-10-CM

## 2019-10-25 DIAGNOSIS — J479 Bronchiectasis, uncomplicated: Principal | ICD-10-CM

## 2019-10-25 DIAGNOSIS — J988 Other specified respiratory disorders: Principal | ICD-10-CM

## 2019-10-25 DIAGNOSIS — B965 Pseudomonas (aeruginosa) (mallei) (pseudomallei) as the cause of diseases classified elsewhere: Principal | ICD-10-CM

## 2019-11-10 MED FILL — SODIUM CHLORIDE 3 % FOR NEBULIZATION: RESPIRATORY_TRACT | 90 days supply | Qty: 720 | Fill #2

## 2019-11-10 MED FILL — ALBUTEROL SULFATE 2.5 MG/3 ML (0.083 %) SOLUTION FOR NEBULIZATION: 90 days supply | Qty: 540 | Fill #2 | Status: AC

## 2019-11-10 MED FILL — ALBUTEROL SULFATE 2.5 MG/3 ML (0.083 %) SOLUTION FOR NEBULIZATION: RESPIRATORY_TRACT | 90 days supply | Qty: 540 | Fill #2

## 2019-11-10 MED FILL — SODIUM CHLORIDE 3 % FOR NEBULIZATION: 90 days supply | Qty: 720 | Fill #2 | Status: AC

## 2019-11-22 DIAGNOSIS — J479 Bronchiectasis, uncomplicated: Principal | ICD-10-CM

## 2019-11-22 DIAGNOSIS — J988 Other specified respiratory disorders: Principal | ICD-10-CM

## 2019-11-22 DIAGNOSIS — B965 Pseudomonas (aeruginosa) (mallei) (pseudomallei) as the cause of diseases classified elsewhere: Principal | ICD-10-CM

## 2019-11-23 ENCOUNTER — Ambulatory Visit: Admit: 2019-11-23 | Discharge: 2019-11-23 | Payer: MEDICARE

## 2019-11-23 ENCOUNTER — Ambulatory Visit: Admit: 2019-11-23 | Discharge: 2019-11-24 | Payer: MEDICARE | Attending: Internal Medicine | Primary: Internal Medicine

## 2019-11-23 DIAGNOSIS — J479 Bronchiectasis, uncomplicated: Principal | ICD-10-CM

## 2019-11-23 DIAGNOSIS — R0600 Dyspnea, unspecified: Principal | ICD-10-CM

## 2019-11-23 DIAGNOSIS — J449 Chronic obstructive pulmonary disease, unspecified: Principal | ICD-10-CM

## 2019-11-23 MED ORDER — SPIRIVA WITH HANDIHALER 18 MCG AND INHALATION CAPSULES
ORAL_CAPSULE | Freq: Every day | RESPIRATORY_TRACT | 11 refills | 30.00000 days | Status: CP
Start: 2019-11-23 — End: 2020-11-22

## 2019-11-25 DIAGNOSIS — R0602 Shortness of breath: Principal | ICD-10-CM

## 2019-11-25 DIAGNOSIS — J479 Bronchiectasis, uncomplicated: Principal | ICD-10-CM

## 2019-11-26 DIAGNOSIS — J479 Bronchiectasis, uncomplicated: Principal | ICD-10-CM

## 2019-11-29 DIAGNOSIS — J479 Bronchiectasis, uncomplicated: Secondary | ICD-10-CM | POA: Diagnosis not present

## 2019-11-29 DIAGNOSIS — B965 Pseudomonas (aeruginosa) (mallei) (pseudomallei) as the cause of diseases classified elsewhere: Secondary | ICD-10-CM | POA: Diagnosis not present

## 2019-11-29 DIAGNOSIS — J988 Other specified respiratory disorders: Secondary | ICD-10-CM | POA: Diagnosis not present

## 2019-12-01 ENCOUNTER — Telehealth: Payer: Self-pay

## 2019-12-01 NOTE — Telephone Encounter (Signed)
REFERRAL ONLY ON FILE FROM [facility] P[facility]ARY [facility] [place] [phone], SENT REFERRAL TO [place]

## 2019-12-02 NOTE — Telephone Encounter (Signed)
Patient called to schedule referral and an echo. I informed him we had the referral, but no echo order. He stated he would contact his Pulmonary doctor to get the order.

## 2019-12-08 ENCOUNTER — Other Ambulatory Visit (HOSPITAL_COMMUNITY): Payer: Self-pay | Admitting: *Deleted

## 2019-12-08 DIAGNOSIS — R0600 Dyspnea, unspecified: Secondary | ICD-10-CM

## 2019-12-20 DIAGNOSIS — J479 Bronchiectasis, uncomplicated: Principal | ICD-10-CM

## 2019-12-20 DIAGNOSIS — B965 Pseudomonas (aeruginosa) (mallei) (pseudomallei) as the cause of diseases classified elsewhere: Principal | ICD-10-CM

## 2019-12-20 DIAGNOSIS — J988 Other specified respiratory disorders: Principal | ICD-10-CM

## 2019-12-31 ENCOUNTER — Ambulatory Visit (HOSPITAL_COMMUNITY): Payer: Medicare PPO | Attending: Internal Medicine

## 2019-12-31 ENCOUNTER — Other Ambulatory Visit: Payer: Self-pay

## 2019-12-31 DIAGNOSIS — R0600 Dyspnea, unspecified: Secondary | ICD-10-CM | POA: Insufficient documentation

## 2020-01-17 DIAGNOSIS — J479 Bronchiectasis, uncomplicated: Principal | ICD-10-CM

## 2020-01-17 DIAGNOSIS — B965 Pseudomonas (aeruginosa) (mallei) (pseudomallei) as the cause of diseases classified elsewhere: Principal | ICD-10-CM

## 2020-01-17 DIAGNOSIS — J988 Other specified respiratory disorders: Principal | ICD-10-CM

## 2020-01-20 ENCOUNTER — Ambulatory Visit: Payer: Medicare PPO | Admitting: Interventional Cardiology

## 2020-02-09 DIAGNOSIS — I10 Essential (primary) hypertension: Secondary | ICD-10-CM | POA: Diagnosis not present

## 2020-02-09 DIAGNOSIS — Z8546 Personal history of malignant neoplasm of prostate: Secondary | ICD-10-CM | POA: Diagnosis not present

## 2020-02-09 DIAGNOSIS — E118 Type 2 diabetes mellitus with unspecified complications: Secondary | ICD-10-CM | POA: Diagnosis not present

## 2020-02-10 DIAGNOSIS — Z8546 Personal history of malignant neoplasm of prostate: Secondary | ICD-10-CM | POA: Diagnosis not present

## 2020-02-10 DIAGNOSIS — E782 Mixed hyperlipidemia: Secondary | ICD-10-CM | POA: Diagnosis not present

## 2020-02-10 DIAGNOSIS — J449 Chronic obstructive pulmonary disease, unspecified: Secondary | ICD-10-CM | POA: Diagnosis not present

## 2020-02-10 DIAGNOSIS — Z0000 Encounter for general adult medical examination without abnormal findings: Secondary | ICD-10-CM | POA: Diagnosis not present

## 2020-02-10 DIAGNOSIS — E118 Type 2 diabetes mellitus with unspecified complications: Secondary | ICD-10-CM | POA: Diagnosis not present

## 2020-02-10 DIAGNOSIS — G458 Other transient cerebral ischemic attacks and related syndromes: Secondary | ICD-10-CM | POA: Diagnosis not present

## 2020-02-10 DIAGNOSIS — I10 Essential (primary) hypertension: Secondary | ICD-10-CM | POA: Diagnosis not present

## 2020-02-10 DIAGNOSIS — Z86010 Personal history of colonic polyps: Secondary | ICD-10-CM | POA: Diagnosis not present

## 2020-02-10 DIAGNOSIS — R809 Proteinuria, unspecified: Secondary | ICD-10-CM | POA: Diagnosis not present

## 2020-02-14 DIAGNOSIS — J988 Other specified respiratory disorders: Principal | ICD-10-CM

## 2020-02-14 DIAGNOSIS — J479 Bronchiectasis, uncomplicated: Principal | ICD-10-CM

## 2020-02-14 DIAGNOSIS — B965 Pseudomonas (aeruginosa) (mallei) (pseudomallei) as the cause of diseases classified elsewhere: Principal | ICD-10-CM

## 2020-02-17 NOTE — Progress Notes (Signed)
Cardiology Office Note   Date:  02/18/2020   ID:  [name], DOB 1940/09/30, MRN [id number]  PCP:  Danny Fess, MD    No chief complaint on file.  DOE  [facility] Readings from Last 3 Encounters:  02/18/20 179 lb 6.4 oz (81.4 kg)  06/29/18 170 lb (77.1 kg)  07/25/17 177 lb 6.4 oz (80.5 kg)       History of Present Illness: [name] is a 79 y.o. male who is being seen today for the evaluation of Shortness of breath at the request of Danny Rankin, MD.  I saw him last in 2014 for subclavian stenosis, HTN and hyperlipidemia.  Over the past few years, he has had worsening COPD and bronchectasis, followed at [facility] pulmonary.  He is not needing supplemental oxygen.  He uses Spiriva and albuterol for his lung issues.   Echo was done showing: "Left ventricular ejection fraction, by estimation, is 65 to 70%. The  left ventricle has normal function. The left ventricle has no regional  wall motion abnormalities. Left ventricular diastolic parameters are  consistent with Grade I diastolic  dysfunction (impaired relaxation).  2. Right ventricular systolic function is normal. The right ventricular  size is normal. Tricuspid regurgitation signal is inadequate for assessing  PA pressure.  3. The mitral valve is grossly normal. Trivial mitral valve  regurgitation.  4. The aortic valve is tricuspid. Aortic valve regurgitation is not  visualized.  5. Aortic dilatation noted. There is borderline dilatation at the level  of the sinuses of Valsalva measuring 38 mm.  6. The inferior vena cava is normal in size with greater than 50%  respiratory variability, suggesting right atrial pressure of 3 mmHg."   He had a fall off a retaining wall last year.  Possible syncope related to dehydration.    Past Medical History:  Diagnosis Date  . Actinic keratosis    AND ECZEMA followed by Dr. Tonia Brooms  . Allergic rhinitis   . Arthritis    in hands and recent trigger finger injections Dr.  Daylene Katayama  . Carotid artery occlusion    abn, subclavian steal  . Chronic sinusitis   . Colon polyps   . COPD (chronic obstructive pulmonary disease) ([place])   . Hearing loss   . Hyperlipidemia   . Hypertension   . Impaired glucose tolerance   . Neuropathy    because of C-spice and L-spine disease-seen by Dr. Primus Bravo  . Prostate cancer ([place]) 2010    Past Surgical History:  Procedure Laterality Date  . BACK SURGERY    . KNEE SURGERY    . NECK SURGERY    . PROSTATE SURGERY    . SHOULDER SURGERY    . TOOTH EXTRACTION    . VIDEO BRONCHOSCOPY Bilateral 07/30/2017   Procedure: VIDEO BRONCHOSCOPY WITHOUT FLUORO;  Surgeon: Tanda Rockers, MD;  Location: [facility] ENDOSCOPY;  Service: Endoscopy;  Laterality: Bilateral;     Current Outpatient Medications  Medication Sig Dispense Refill  . albuterol (PROVENTIL) (2.5 MG/3ML) 0.083% nebulizer solution Take 3 mLs by nebulization in the morning and at bedtime.     Marland Kitchen amLODipine (NORVASC) 5 MG tablet Take 5 mg by mouth daily.     Marland Kitchen aspirin 81 MG tablet Take 81 mg by mouth daily.    . Cholecalciferol (VITAMIN D) 2000 units tablet Take 2,000 Units by mouth daily.    . Coenzyme Q10 (CO Q 10 PO) Take 300 mg by mouth daily.     Marland Kitchen  losartan (COZAAR) 100 MG tablet Take 1 tablet by mouth daily.    . pravastatin (PRAVACHOL) 20 MG tablet Take 20 mg by mouth daily.     Marland Kitchen Respiratory Therapy Supplies (FLUTTER) DEVI Use as directed 1 each 0  . sodium chloride HYPERTONIC 3 % nebulizer solution Take 4 mLs by nebulization 2 (two) times daily.     Marland Kitchen tiotropium (SPIRIVA) 18 MCG inhalation capsule Place 18 mcg into inhaler and inhale daily.    . vitamin B-12 (CYANOCOBALAMIN) 1000 MCG tablet Take 1,000 mcg by mouth daily.    . budesonide-formoterol (SYMBICORT) 80-4.5 MCG/ACT inhaler Inhale 2 puffs into the lungs 2 (two) times daily. 1 Inhaler 11  . SYMBICORT 160-4.5 MCG/ACT inhaler INHALE 2 PUFFS THE FIRST THING IN THE MORNING AND THEN ANOTHER 2 PUFFS ABOUT 12 HOURS LATER 10.2  g 0   No current facility-administered medications for this visit.    Allergies:   Hydrocodone-acetaminophen, Wellbutrin [bupropion], and Zocor [simvastatin]    Social History:  The patient  reports that he quit smoking about 15 years ago. His smoking use included cigarettes. He has a 80.50 pack-year smoking history. He has never used smokeless tobacco. He reports current alcohol use. He reports that he does not use drugs.   Family History:  The patient's family history includes Heart disease in his father; Heart failure in his father; Leukemia in his brother; Lymphoma in his father; Uterine cancer in his mother.    ROS:  Please see the history of present illness.   Otherwise, review of systems are positive for DOE.   All other systems are reviewed and negative.    PHYSICAL EXAM: VS:  BP (!) 114/52   Pulse 70   Ht 5\' 8"  (1.727 m)   Wt 179 lb 6.4 oz (81.4 kg)   SpO2 91%   BMI 27.28 kg/m  , BMI Body mass index is 27.28 kg/m. GEN: Well nourished, well developed, in no acute distress  HEENT: normal  Neck: no JVD, carotid bruits, or masses Cardiac: RRR; no murmurs, rubs, or gallops,no edema  Respiratory:  clear to auscultation bilaterally, normal work of breathing GI: soft, nontender, nondistended, + BS MS: no deformity or atrophy ; 3+ left radial pulse, 2+ right radial pulse Skin: warm and dry, no rash Neuro:  Strength and sensation are intact Psych: euthymic mood, full affect   EKG:   The ekg ordered today demonstrates NSR, no ST changes   Recent Labs: No results found for requested labs within last 8760 hours.   Lipid Panel No results found for: CHOL, TRIG, HDL, CHOLHDL, VLDL, LDLCALC, LDLDIRECT   Other studies Reviewed: Additional studies/ records that were reviewed today with results demonstrating: prior records reviewed-pulmonary note from [facility] reviewed.  Echo results reviewed.   ASSESSMENT AND PLAN:  1. *DOE:  Has gotten worse over the past year.  Also treated  for COPD.  Could also be an anginal equivalent.  He has known peripheral arterial disease along with other risk factors.  Okay to use one-time dose of metoprolol 50 mg to help with CT scan imaging.  Would try to avoid beta-blocker long-term given his obstructive lung disease. 2. PAD: Known right subclavian disease.  Switch pravastatin to Crestor 20 mg daily for more potency.  Intolerant of Zocor. 3. HTN: The current medical regimen is effective;  continue present plan and medications. 4. Hyperlipidemia: LDL 77 in 8/21.  Lipids well controlled.  Should improve further with rosuvastatin.  Check liver and lipids in 2  months.   Current medicines are reviewed at length with the patient today.  The patient concerns regarding his medicines were addressed.  The following changes have been made:  No change  Labs/ tests ordered today include: CTA coronaries No orders of the defined types were placed in this encounter.   Recommend 150 minutes/week of aerobic exercise Low fat, low carb, high fiber diet recommended  Disposition:   FU in 1 year, or sooner if he has severe CAD   Signed, Larae Grooms, MD  02/18/2020 2:32 PM    [facility] [address], [place], [place]  [address] Phone: [phone]; Fax: [phone]

## 2020-02-18 ENCOUNTER — Other Ambulatory Visit: Payer: Self-pay

## 2020-02-18 ENCOUNTER — Ambulatory Visit (INDEPENDENT_AMBULATORY_CARE_PROVIDER_SITE_OTHER): Payer: Medicare PPO | Admitting: Interventional Cardiology

## 2020-02-18 ENCOUNTER — Encounter: Payer: Self-pay | Admitting: Interventional Cardiology

## 2020-02-18 VITALS — BP 114/52 | HR 70 | Ht 68.0 in | Wt 179.4 lb

## 2020-02-18 DIAGNOSIS — I10 Essential (primary) hypertension: Secondary | ICD-10-CM | POA: Diagnosis not present

## 2020-02-18 DIAGNOSIS — I739 Peripheral vascular disease, unspecified: Secondary | ICD-10-CM

## 2020-02-18 DIAGNOSIS — E782 Mixed hyperlipidemia: Secondary | ICD-10-CM

## 2020-02-18 DIAGNOSIS — I771 Stricture of artery: Secondary | ICD-10-CM

## 2020-02-18 DIAGNOSIS — R0609 Other forms of dyspnea: Secondary | ICD-10-CM

## 2020-02-18 DIAGNOSIS — R0600 Dyspnea, unspecified: Secondary | ICD-10-CM

## 2020-02-18 MED ORDER — ROSUVASTATIN CALCIUM 20 MG PO TABS: 20.0000 mg | ORAL_TABLET | Freq: Every day | ORAL | 3 refills | Status: DC

## 2020-02-18 MED ORDER — METOPROLOL TARTRATE 50 MG PO TABS: ORAL_TABLET | ORAL | 0 refills | Status: DC

## 2020-02-18 NOTE — Patient Instructions (Addendum)
Medication Instructions:  Your physician has recommended you make the following change in your medication:   1. STOP: pravastatin  2. START: rosuvastatin (crestor) 20 mg tablet: Take 1 tablet by mouth once a day  *If you need a refill on your cardiac medications before your next appointment, please call your pharmacy*   Lab Work: Your physician recommends that you return for a FASTING lipid profile and liver function panel in 2 months   If you have labs (blood work) drawn today and your tests are completely normal, you will receive your results only by: Marland Kitchen MyChart Message (if you have MyChart) OR . A paper copy in the mail If you have any lab test that is abnormal or we need to change your treatment, we will call you to review the results.   Testing/Procedures: Your physician has requested that you have cardiac CT.  Follow-Up: At [facility], you and your health needs are our priority.  As part of our continuing mission to provide you with exceptional heart care, we have created designated Provider Care Teams.  These Care Teams include your primary Cardiologist (physician) and Advanced Practice Providers (APPs -  Physician Assistants and Nurse Practitioners) who all work together to provide you with the care you need, when you need it.  We recommend signing up for the patient portal called "MyChart".  Sign up information is provided on this After Visit Summary.  MyChart is used to connect with patients for Virtual Visits (Telemedicine).  Patients are able to view lab/test results, encounter notes, upcoming appointments, etc.  Non-urgent messages can be sent to your provider as well.   To learn more about what you can do with MyChart, go to [email or web address].    Your next appointment:   12 month(s)  The format for your next appointment:   In Person  Provider:   You may see Casandra Doffing, MD or one of the following Advanced Practice Providers on your designated Care Team:     Melina Copa, PA-C  Ermalinda Barrios, PA-C    Other Instructions Your cardiac CT will be scheduled at one of the below locations:   [facility] [address] [place], [place] [address] [phone]  [facility] [address] [place], [place] [address] [phone]  If scheduled at [facility], please arrive at the [facility] main entrance of [facility] 30 minutes prior to test start time. Proceed to the [facility] Radiology Department (first floor) to check-in and test prep.  If scheduled at [facility], please arrive 15 mins early for check-in and test prep.  Please follow these instructions carefully (unless otherwise directed):   On the Night Before the Test: . Be sure to Drink plenty of water. . Do not consume any caffeinated/decaffeinated beverages or chocolate 12 hours prior to your test. . Do not take any antihistamines 12 hours prior to your test.   On the Day of the Test: . Drink plenty of water. Do not drink any water within one hour of the test. . Do not eat any food 4 hours prior to the test. . You may take your regular medications prior to the test.  . Take metoprolol (Lopressor) 50 MG two hours prior to test.       After the Test: . Drink plenty of water. . After receiving IV contrast, you may experience a mild flushed feeling. This is normal. . On occasion, you  may experience a mild rash up to 24 hours after the test. This is not dangerous. If this occurs, you can take Benadryl 25 mg and increase your fluid intake. . If you experience trouble breathing, this can be serious. If it is severe call 911 IMMEDIATELY. If it is mild, please call our office.    Once we have confirmed authorization from your insurance company, we will call you to set up a date and time for your test. Based on how quickly your insurance processes prior authorizations  requests, please allow up to 4 weeks to be contacted for scheduling your Cardiac CT appointment. Be advised that routine Cardiac CT appointments could be scheduled as many as 8 weeks after your provider has ordered it.  For non-scheduling related questions, please contact the cardiac imaging nurse navigator should you have any questions/concerns: Marchia Bond, Cardiac Imaging Nurse Navigator Burley Saver, Interim Cardiac Imaging Nurse [place] [facility] Direct Office Dial: [phone]   For scheduling needs, including cancellations and rescheduling, please call Vivien Rota at [phone], option 3.

## 2020-03-09 ENCOUNTER — Telehealth: Payer: Self-pay | Admitting: Interventional Cardiology

## 2020-03-09 ENCOUNTER — Other Ambulatory Visit: Payer: Self-pay

## 2020-03-09 DIAGNOSIS — Z01812 Encounter for preprocedural laboratory examination: Secondary | ICD-10-CM

## 2020-03-09 DIAGNOSIS — R0609 Other forms of dyspnea: Secondary | ICD-10-CM

## 2020-03-09 DIAGNOSIS — Z23 Encounter for immunization: Secondary | ICD-10-CM | POA: Diagnosis not present

## 2020-03-09 NOTE — Telephone Encounter (Signed)
Called patient and scheduled BMET on 9/17 for Cardiac CT on 9/22.

## 2020-03-09 NOTE — Telephone Encounter (Signed)
New Message   Pt is calling, he says he thinks he was told he will need lab work before he has his CT.  He is wondering if he does need it, when he needs to get it    Please call

## 2020-03-10 ENCOUNTER — Other Ambulatory Visit: Payer: Medicare PPO | Admitting: *Deleted

## 2020-03-10 ENCOUNTER — Other Ambulatory Visit: Payer: Self-pay

## 2020-03-10 DIAGNOSIS — Z01812 Encounter for preprocedural laboratory examination: Secondary | ICD-10-CM

## 2020-03-10 DIAGNOSIS — R0600 Dyspnea, unspecified: Secondary | ICD-10-CM | POA: Diagnosis not present

## 2020-03-10 DIAGNOSIS — R0609 Other forms of dyspnea: Secondary | ICD-10-CM

## 2020-03-10 LAB — BASIC METABOLIC PANEL
BUN/Creatinine Ratio: 17 (ref 10–24)
BUN: 13 mg/dL (ref 8–27)
CO2: 23 mmol/L (ref 20–29)
Calcium: 9.3 mg/dL (ref 8.6–10.2)
Chloride: 100 mmol/L (ref 96–106)
Creatinine, Ser: 0.78 mg/dL (ref 0.76–1.27)
GFR calc Af Amer: 99 mL/min/{1.73_m2} (ref >59)
GFR calc non Af Amer: 86 mL/min/{1.73_m2} (ref >59)
Glucose: 143 mg/dL — ABNORMAL HIGH (ref 65–99)
Potassium: 4.4 mmol/L (ref 3.5–5.2)
Sodium: 139 mmol/L (ref 134–144)

## 2020-03-13 ENCOUNTER — Telehealth (HOSPITAL_COMMUNITY): Payer: Self-pay | Admitting: *Deleted

## 2020-03-13 ENCOUNTER — Telehealth (HOSPITAL_COMMUNITY): Payer: Self-pay | Admitting: Emergency Medicine

## 2020-03-13 DIAGNOSIS — J479 Bronchiectasis, uncomplicated: Principal | ICD-10-CM

## 2020-03-13 DIAGNOSIS — J988 Other specified respiratory disorders: Principal | ICD-10-CM

## 2020-03-13 DIAGNOSIS — B965 Pseudomonas (aeruginosa) (mallei) (pseudomallei) as the cause of diseases classified elsewhere: Principal | ICD-10-CM

## 2020-03-13 NOTE — Telephone Encounter (Signed)
Attempted to call patient regarding upcoming cardiac CT appointment. °Left message on voicemail with name and callback number ° °Bonetta Mostek Tai RN Navigator Cardiac Imaging °[facility] °[phone] Office °[phone] Cell ° °

## 2020-03-13 NOTE — Telephone Encounter (Signed)
Pt returning phone call regarding upcoming cardiac imaging study; pt verbalizes understanding of appt date/time, parking situation and where to check in, pre-test NPO status and medications ordered, and verified current allergies; name and call back number provided for further questions should they arise Luca Burston RN Navigator Cardiac Imaging [facility] [phone] office [phone] cell   

## 2020-03-15 ENCOUNTER — Encounter (HOSPITAL_COMMUNITY): Payer: Self-pay

## 2020-03-15 ENCOUNTER — Other Ambulatory Visit: Payer: Self-pay

## 2020-03-15 ENCOUNTER — Ambulatory Visit (HOSPITAL_COMMUNITY)
Admission: RE | Admit: 2020-03-15 | Discharge: 2020-03-15 | Disposition: A | Discharge status: Home / Self Care | Payer: Medicare PPO | Source: Ambulatory Visit | Attending: Interventional Cardiology | Admitting: Interventional Cardiology

## 2020-03-15 DIAGNOSIS — R0609 Other forms of dyspnea: Secondary | ICD-10-CM

## 2020-03-15 DIAGNOSIS — I771 Stricture of artery: Secondary | ICD-10-CM | POA: Diagnosis not present

## 2020-03-15 DIAGNOSIS — R0600 Dyspnea, unspecified: Secondary | ICD-10-CM | POA: Diagnosis not present

## 2020-03-15 DIAGNOSIS — I700 Atherosclerosis of aorta: Secondary | ICD-10-CM | POA: Diagnosis not present

## 2020-03-15 DIAGNOSIS — I10 Essential (primary) hypertension: Secondary | ICD-10-CM | POA: Diagnosis not present

## 2020-03-15 DIAGNOSIS — E782 Mixed hyperlipidemia: Secondary | ICD-10-CM | POA: Diagnosis not present

## 2020-03-15 DIAGNOSIS — J479 Bronchiectasis, uncomplicated: Secondary | ICD-10-CM | POA: Insufficient documentation

## 2020-03-15 DIAGNOSIS — I739 Peripheral vascular disease, unspecified: Secondary | ICD-10-CM | POA: Diagnosis not present

## 2020-03-15 MED ORDER — IOHEXOL 350 MG/ML SOLN
80.0000 mL | Freq: Once | INTRAVENOUS | Status: AC | PRN
  Administered 2020-03-15: 80 mL via INTRAVENOUS

## 2020-03-15 MED ORDER — NITROGLYCERIN 0.4 MG SL SUBL
SUBLINGUAL_TABLET | SUBLINGUAL | Status: AC
  Administered 2020-03-15: 0.8 mg
  Filled 2020-03-15: qty 2

## 2020-03-16 ENCOUNTER — Ambulatory Visit: Payer: Medicare PPO | Admitting: Interventional Cardiology

## 2020-04-10 DIAGNOSIS — J479 Bronchiectasis, uncomplicated: Principal | ICD-10-CM

## 2020-04-10 DIAGNOSIS — B965 Pseudomonas (aeruginosa) (mallei) (pseudomallei) as the cause of diseases classified elsewhere: Principal | ICD-10-CM

## 2020-04-10 DIAGNOSIS — J988 Other specified respiratory disorders: Principal | ICD-10-CM

## 2020-04-18 DIAGNOSIS — D485 Neoplasm of uncertain behavior of skin: Secondary | ICD-10-CM | POA: Diagnosis not present

## 2020-04-18 DIAGNOSIS — L219 Seborrheic dermatitis, unspecified: Secondary | ICD-10-CM | POA: Diagnosis not present

## 2020-04-18 DIAGNOSIS — D225 Melanocytic nevi of trunk: Secondary | ICD-10-CM | POA: Diagnosis not present

## 2020-04-18 DIAGNOSIS — L814 Other melanin hyperpigmentation: Secondary | ICD-10-CM | POA: Diagnosis not present

## 2020-04-18 DIAGNOSIS — Z85828 Personal history of other malignant neoplasm of skin: Secondary | ICD-10-CM | POA: Diagnosis not present

## 2020-04-18 DIAGNOSIS — L570 Actinic keratosis: Secondary | ICD-10-CM | POA: Diagnosis not present

## 2020-04-18 DIAGNOSIS — C44629 Squamous cell carcinoma of skin of left upper limb, including shoulder: Secondary | ICD-10-CM | POA: Diagnosis not present

## 2020-04-18 DIAGNOSIS — L821 Other seborrheic keratosis: Secondary | ICD-10-CM | POA: Diagnosis not present

## 2020-04-18 DIAGNOSIS — M720 Palmar fascial fibromatosis [Dupuytren]: Secondary | ICD-10-CM | POA: Diagnosis not present

## 2020-04-19 ENCOUNTER — Other Ambulatory Visit: Payer: Self-pay

## 2020-04-19 ENCOUNTER — Other Ambulatory Visit: Payer: Medicare PPO | Admitting: *Deleted

## 2020-04-19 DIAGNOSIS — I739 Peripheral vascular disease, unspecified: Secondary | ICD-10-CM | POA: Diagnosis not present

## 2020-04-19 DIAGNOSIS — E782 Mixed hyperlipidemia: Secondary | ICD-10-CM | POA: Diagnosis not present

## 2020-04-19 DIAGNOSIS — I10 Essential (primary) hypertension: Secondary | ICD-10-CM

## 2020-04-19 DIAGNOSIS — I771 Stricture of artery: Secondary | ICD-10-CM | POA: Diagnosis not present

## 2020-04-19 DIAGNOSIS — R0600 Dyspnea, unspecified: Secondary | ICD-10-CM

## 2020-04-19 DIAGNOSIS — R0609 Other forms of dyspnea: Secondary | ICD-10-CM

## 2020-04-19 LAB — HEPATIC FUNCTION PANEL
ALT: 21 IU/L (ref 0–44)
AST: 22 IU/L (ref 0–40)
Albumin: 4.3 g/dL (ref 3.7–4.7)
Alkaline Phosphatase: 126 IU/L — ABNORMAL HIGH (ref 44–121)
Bilirubin Total: 0.5 mg/dL (ref 0.0–1.2)
Bilirubin, Direct: 0.2 mg/dL (ref 0.00–0.40)
Total Protein: 6.9 g/dL (ref 6.0–8.5)

## 2020-04-19 LAB — LIPID PANEL
Chol/HDL Ratio: 1.8 ratio (ref 0.0–5.0)
Cholesterol, Total: 156 mg/dL (ref 100–199)
HDL: 89 mg/dL (ref >39)
LDL Chol Calc (NIH): 56 mg/dL (ref 0–99)
Triglycerides: 50 mg/dL (ref 0–149)
VLDL Cholesterol Cal: 11 mg/dL (ref 5–40)

## 2020-05-08 DIAGNOSIS — B965 Pseudomonas (aeruginosa) (mallei) (pseudomallei) as the cause of diseases classified elsewhere: Principal | ICD-10-CM

## 2020-05-08 DIAGNOSIS — J479 Bronchiectasis, uncomplicated: Principal | ICD-10-CM

## 2020-05-08 DIAGNOSIS — J988 Other specified respiratory disorders: Principal | ICD-10-CM

## 2020-05-24 DIAGNOSIS — C44629 Squamous cell carcinoma of skin of left upper limb, including shoulder: Secondary | ICD-10-CM | POA: Diagnosis not present

## 2020-05-24 DIAGNOSIS — L905 Scar conditions and fibrosis of skin: Secondary | ICD-10-CM | POA: Diagnosis not present

## 2020-06-05 DIAGNOSIS — B965 Pseudomonas (aeruginosa) (mallei) (pseudomallei) as the cause of diseases classified elsewhere: Principal | ICD-10-CM

## 2020-06-05 DIAGNOSIS — J479 Bronchiectasis, uncomplicated: Principal | ICD-10-CM

## 2020-06-05 DIAGNOSIS — J988 Other specified respiratory disorders: Principal | ICD-10-CM

## 2020-06-06 DIAGNOSIS — H43811 Vitreous degeneration, right eye: Secondary | ICD-10-CM | POA: Diagnosis not present

## 2020-06-06 DIAGNOSIS — Z961 Presence of intraocular lens: Secondary | ICD-10-CM | POA: Diagnosis not present

## 2020-06-06 DIAGNOSIS — E119 Type 2 diabetes mellitus without complications: Secondary | ICD-10-CM | POA: Diagnosis not present

## 2020-06-06 DIAGNOSIS — H04123 Dry eye syndrome of bilateral lacrimal glands: Secondary | ICD-10-CM | POA: Diagnosis not present

## 2020-06-27 ENCOUNTER — Ambulatory Visit: Admit: 2020-06-27 | Discharge: 2020-06-28 | Payer: MEDICARE | Attending: Internal Medicine | Primary: Internal Medicine

## 2020-06-27 DIAGNOSIS — J988 Other specified respiratory disorders: Principal | ICD-10-CM

## 2020-06-27 DIAGNOSIS — J479 Bronchiectasis, uncomplicated: Principal | ICD-10-CM

## 2020-06-27 DIAGNOSIS — J449 Chronic obstructive pulmonary disease, unspecified: Principal | ICD-10-CM

## 2020-06-27 DIAGNOSIS — B965 Pseudomonas (aeruginosa) (mallei) (pseudomallei) as the cause of diseases classified elsewhere: Principal | ICD-10-CM

## 2020-06-27 MED ORDER — NEBULIZER ACCESSORIES KIT
PACK | 3 refills | 0.00000 days | Status: CP
Start: 2020-06-27 — End: ?

## 2020-06-27 MED ORDER — SODIUM CHLORIDE 3.5 % FOR NEBULIZATION
Freq: Two times a day (BID) | RESPIRATORY_TRACT | 3 refills | 0 days | Status: CP
Start: 2020-06-27 — End: 2021-06-27
  Filled 2020-07-07: qty 720, 90d supply, fill #0

## 2020-06-27 MED ORDER — ALBUTEROL SULFATE 2.5 MG/3 ML (0.083 %) SOLUTION FOR NEBULIZATION
Freq: Two times a day (BID) | RESPIRATORY_TRACT | 3 refills | 90.00000 days | Status: CP
Start: 2020-06-27 — End: 2021-06-27
  Filled 2020-07-07: qty 540, 90d supply, fill #0

## 2020-07-03 ENCOUNTER — Telehealth: Payer: Self-pay | Admitting: *Deleted

## 2020-07-03 DIAGNOSIS — J479 Bronchiectasis, uncomplicated: Principal | ICD-10-CM

## 2020-07-03 DIAGNOSIS — M1712 Unilateral primary osteoarthritis, left knee: Secondary | ICD-10-CM | POA: Diagnosis not present

## 2020-07-03 NOTE — Telephone Encounter (Signed)
   [facility] Pre-operative Risk Assessment    HEARTCARE STAFF: - Please ensure there is not already an duplicate clearance open for this procedure. - Under Visit Info/Reason for Call, type in Other and utilize the format Clearance MM/DD/YY or Clearance TBD. Do not use dashes or single digits. - If request is for dental extraction, please clarify the # of teeth to be extracted.  Request for surgical clearance:  1. What type of surgery is being performed? LEFT TOTAL KNEE REPLACEMENT    2. When is this surgery scheduled? TBD   3. What type of clearance is required (medical clearance vs. Pharmacy clearance to hold med vs. Both)? MEDICAL  4. Are there any medications that need to be held prior to surgery and how long? ASA   5. Practice name and name of physician performing surgery? [facility]; DR. Quillian Quince CAFFREY   6. What is the office phone number? [phone]   7.   What is the office fax number? [place].   Anesthesia type (None, local, MAC, general) ? CHOICE   Julaine Hua 07/03/2020, 5:37 PM  _________________________________________________________________   (provider comments below)

## 2020-07-04 NOTE — Telephone Encounter (Signed)
   Primary Cardiologist: Larae Grooms, MD  Chart reviewed as part of pre-operative protocol coverage. Given past medical history and time since last visit, based on ACC/AHA guidelines, [name] would be at acceptable risk for the planned procedure without further cardiovascular testing. Recent coronary CT showed minimal CAD. He is able to hold aspirin for 5-7 days prior to the procedure and restart as soon as possible after the procedure at the surgeon's discretion.   The patient was advised that if he develops new symptoms prior to surgery to contact our office to arrange for a follow-up visit, and he verbalized understanding.  I will route this recommendation to the requesting party via Epic fax function and remove from pre-op pool.  Please call with questions.  Almyra Deforest, [place] 07/04/2020, 9:31 AM

## 2020-07-07 MED FILL — LC PLUS MISC: 90 days supply | Qty: 3 | Fill #0

## 2020-07-21 DIAGNOSIS — E1169 Type 2 diabetes mellitus with other specified complication: Secondary | ICD-10-CM | POA: Diagnosis not present

## 2020-07-21 DIAGNOSIS — J449 Chronic obstructive pulmonary disease, unspecified: Secondary | ICD-10-CM | POA: Diagnosis not present

## 2020-07-21 DIAGNOSIS — I10 Essential (primary) hypertension: Secondary | ICD-10-CM | POA: Diagnosis not present

## 2020-07-31 DIAGNOSIS — J479 Bronchiectasis, uncomplicated: Principal | ICD-10-CM

## 2020-07-31 DIAGNOSIS — M1712 Unilateral primary osteoarthritis, left knee: Secondary | ICD-10-CM | POA: Diagnosis not present

## 2020-08-07 ENCOUNTER — Other Ambulatory Visit: Payer: Self-pay

## 2020-08-07 ENCOUNTER — Encounter (HOSPITAL_COMMUNITY): Payer: Self-pay

## 2020-08-07 ENCOUNTER — Ambulatory Visit: Payer: Self-pay | Admitting: Physician Assistant

## 2020-08-07 NOTE — H&P (View-Only) (Signed)
TOTAL KNEE ADMISSION H&P  Patient is being admitted for left total knee arthroplasty.  Subjective:  Chief Complaint:left knee pain.  HPI: [name], 80 y.o. male, has a history of pain and functional disability in the left knee due to arthritis and has failed non-surgical conservative treatments for greater than 12 weeks to includeNSAID's and/or analgesics, corticosteriod injections and activity modification.  Onset of symptoms was gradual, starting 10 years ago with gradually worsening course since that time. The patient noted prior procedures on the knee to include  arthroscopy and menisectomy on the left knee(s).  Patient currently rates pain in the left knee(s) at 8 out of 10 with activity. Patient has night pain, worsening of pain with activity and weight bearing, pain that interferes with activities of daily living, pain with passive range of motion, crepitus and joint swelling.  Patient has evidence of periarticular osteophytes and joint space narrowing by imaging studies.  There is no active infection.  Patient Active Problem List   Diagnosis Date Noted  . COPD GOLD II 04/25/2016  . Multiple pulmonary nodules asoc with Bronchiectasis  03/09/2016  . Subclavian artery stenosis ([place]) 10/20/2013  . Pulsatile abdomen 10/20/2013  . Mixed hyperlipidemia 10/20/2013  . Essential hypertension, benign 03/09/2013  . Cough 01/08/2013   Past Medical History:  Diagnosis Date  . Actinic keratosis    AND ECZEMA followed by Dr. Tonia Brooms  . Allergic rhinitis   . Arthritis    in hands and recent trigger finger injections Dr. Daylene Katayama  . Carotid artery occlusion    abn, subclavian steal  . Chronic sinusitis   . Colon polyps   . COPD (chronic obstructive pulmonary disease) ([place])   . Diabetes mellitus without complication ([place])   . Hearing loss   . Hyperlipidemia   . Hypertension   . Impaired glucose tolerance   . Neuropathy    because of C-spice and L-spine disease-seen by Dr. Primus Bravo  .  Prostate cancer ([place]) 2010    Past Surgical History:  Procedure Laterality Date  . BACK SURGERY    . KNEE SURGERY    . NECK SURGERY    . PROSTATE SURGERY    . SHOULDER SURGERY    . TOOTH EXTRACTION    . VIDEO BRONCHOSCOPY Bilateral 07/30/2017   Procedure: VIDEO BRONCHOSCOPY WITHOUT FLUORO;  Surgeon: Tanda Rockers, MD;  Location: [facility];  Service: Endoscopy;  Laterality: Bilateral;    Current Outpatient Medications  Medication Sig Dispense Refill Last Dose  . albuterol (PROVENTIL) (2.5 MG/3ML) 0.083% nebulizer solution Take 3 mLs by nebulization in the morning and at bedtime.     Marland Kitchen amLODipine (NORVASC) 5 MG tablet Take 5 mg by mouth at bedtime.     Marland Kitchen aspirin 81 MG tablet Take 81 mg by mouth at bedtime.     . cholecalciferol (VITAMIN D3) 25 MCG (1000 UNIT) tablet Take 1,000 Units by mouth daily.     . Coenzyme Q10 (CO Q 10 PO) Take 300 mg by mouth daily.      Marland Kitchen losartan (COZAAR) 100 MG tablet Take 1 tablet by mouth at bedtime.     . pravastatin (PRAVACHOL) 20 MG tablet Take 20 mg by mouth daily.     Marland Kitchen Respiratory Therapy Supplies (FLUTTER) DEVI Use as directed 1 each 0   . sodium chloride HYPERTONIC 3 % nebulizer solution Take 4 mLs by nebulization in the morning and at bedtime.     Marland Kitchen tiotropium (SPIRIVA) 18 MCG inhalation capsule Place 18 mcg  into inhaler and inhale daily.     . vitamin B-12 (CYANOCOBALAMIN) 1000 MCG tablet Take 1,000 mcg by mouth at bedtime.      No current facility-administered medications for this visit.   Allergies  Allergen Reactions  . Hydrocodone-Acetaminophen Nausea And Vomiting  . Wellbutrin [Bupropion] Nausea And Vomiting  . Zocor [Simvastatin]     Unknown    Social History   Tobacco Use  . Smoking status: Former Smoker    Packs/day: 1.50    Years: 46.00    Pack years: 69.00    Types: Cigarettes    Quit date: 06/24/2004    Years since quitting: 16.1  . Smokeless tobacco: Never Used  Substance Use Topics  . Alcohol use: Yes    Comment: 1-2  glasses of wine daily    Family History  Problem Relation Age of Onset  . Heart disease Father        valve replacement in the 60's  . Lymphoma Father   . Heart failure Father   . Uterine cancer Mother        with mets to lungs, was a smoker  . Leukemia Brother      Review of Systems  HENT: Positive for hearing loss and tinnitus.   Genitourinary: Positive for frequency.  Musculoskeletal: Positive for arthralgias.  Hematological: Bruises/bleeds easily.  All other systems reviewed and are negative.   Objective:  Physical Exam Constitutional:      General: He is not in acute distress.    Appearance: Normal appearance.  HENT:     Head: Normocephalic and atraumatic.  Eyes:     Extraocular Movements: Extraocular movements intact.     Pupils: Pupils are equal, round, and reactive to light.  Cardiovascular:     Rate and Rhythm: Normal rate and regular rhythm.     Pulses: Normal pulses.     Heart sounds: Normal heart sounds. No murmur heard.   Pulmonary:     Effort: Pulmonary effort is normal. No respiratory distress.     Breath sounds: Normal breath sounds.  Abdominal:     General: Abdomen is flat. Bowel sounds are normal. There is no distension.     Palpations: Abdomen is soft.     Tenderness: There is no abdominal tenderness.  Musculoskeletal:     Cervical back: Normal range of motion.     Left knee: Swelling and bony tenderness present. No effusion. Tenderness present.  Lymphadenopathy:     Cervical: No cervical adenopathy.  Skin:    General: Skin is warm and dry.     Findings: No erythema or rash.  Neurological:     General: No focal deficit present.     Mental Status: He is alert and oriented to person, place, and time.  Psychiatric:        Mood and Affect: Mood normal.        Behavior: Behavior normal.     Vital signs in last 24 hours: @VSRANGES @  Labs:   Estimated body mass index is 27.28 kg/m as calculated from the following:   Height as of  02/18/20: 5\' 8"  (1.727 m).   Weight as of 02/18/20: 81.4 kg.   Imaging Review Plain radiographs demonstrate moderate degenerative joint disease of the left knee(s). The overall alignment ismild varus. The bone quality appears to be good for age and reported activity level.      Assessment/Plan:  End stage arthritis, left knee   The patient history, physical examination, clinical judgment of the  provider and imaging studies are consistent with end stage degenerative joint disease of the left knee(s) and total knee arthroplasty is deemed medically necessary. The treatment options including medical management, injection therapy arthroscopy and arthroplasty were discussed at length. The risks and benefits of total knee arthroplasty were presented and reviewed. The risks due to aseptic loosening, infection, stiffness, patella tracking problems, thromboembolic complications and other imponderables were discussed. The patient acknowledged the explanation, agreed to proceed with the plan and consent was signed. Patient is being admitted for inpatient treatment for surgery, pain control, PT, OT, prophylactic antibiotics, VTE prophylaxis, progressive ambulation and ADL's and discharge planning. The patient is planning to be discharged home with home health services    Anticipated LOS equal to or greater than 2 midnights due to - Age 17 and older with one or more of the following:  - Obesity  - Expected need for hospital services (PT, OT, Nursing) required for safe  discharge  - Anticipated need for postoperative skilled nursing care or inpatient rehab  - Active co-morbidities: Diabetes, Coronary Artery Disease and Respiratory Failure/COPD OR   - Unanticipated findings during/Post Surgery: None  - Patient is a high risk of re-admission due to: None

## 2020-08-07 NOTE — H&P (Signed)
TOTAL KNEE ADMISSION H&P  Patient is being admitted for left total knee arthroplasty.  Subjective:  Chief Complaint:left knee pain.  HPI: [name], 80 y.o. male, has a history of pain and functional disability in the left knee due to arthritis and has failed non-surgical conservative treatments for greater than 12 weeks to includeNSAID's and/or analgesics, corticosteriod injections and activity modification.  Onset of symptoms was gradual, starting 10 years ago with gradually worsening course since that time. The patient noted prior procedures on the knee to include  arthroscopy and menisectomy on the left knee(s).  Patient currently rates pain in the left knee(s) at 8 out of 10 with activity. Patient has night pain, worsening of pain with activity and weight bearing, pain that interferes with activities of daily living, pain with passive range of motion, crepitus and joint swelling.  Patient has evidence of periarticular osteophytes and joint space narrowing by imaging studies.  There is no active infection.  Patient Active Problem List   Diagnosis Date Noted  . COPD GOLD II 04/25/2016  . Multiple pulmonary nodules asoc with Bronchiectasis  03/09/2016  . Subclavian artery stenosis ([place]) 10/20/2013  . Pulsatile abdomen 10/20/2013  . Mixed hyperlipidemia 10/20/2013  . Essential hypertension, benign 03/09/2013  . Cough 01/08/2013   Past Medical History:  Diagnosis Date  . Actinic keratosis    AND ECZEMA followed by Dr. Tonia Brooms  . Allergic rhinitis   . Arthritis    in hands and recent trigger finger injections Dr. Daylene Katayama  . Carotid artery occlusion    abn, subclavian steal  . Chronic sinusitis   . Colon polyps   . COPD (chronic obstructive pulmonary disease) ([place])   . Diabetes mellitus without complication ([place])   . Hearing loss   . Hyperlipidemia   . Hypertension   . Impaired glucose tolerance   . Neuropathy    because of C-spice and L-spine disease-seen by Dr. Primus Bravo  .  Prostate cancer ([place]) 2010    Past Surgical History:  Procedure Laterality Date  . BACK SURGERY    . KNEE SURGERY    . NECK SURGERY    . PROSTATE SURGERY    . SHOULDER SURGERY    . TOOTH EXTRACTION    . VIDEO BRONCHOSCOPY Bilateral 07/30/2017   Procedure: VIDEO BRONCHOSCOPY WITHOUT FLUORO;  Surgeon: Tanda Rockers, MD;  Location: [facility];  Service: Endoscopy;  Laterality: Bilateral;    Current Outpatient Medications  Medication Sig Dispense Refill Last Dose  . albuterol (PROVENTIL) (2.5 MG/3ML) 0.083% nebulizer solution Take 3 mLs by nebulization in the morning and at bedtime.     Marland Kitchen amLODipine (NORVASC) 5 MG tablet Take 5 mg by mouth at bedtime.     Marland Kitchen aspirin 81 MG tablet Take 81 mg by mouth at bedtime.     . cholecalciferol (VITAMIN D3) 25 MCG (1000 UNIT) tablet Take 1,000 Units by mouth daily.     . Coenzyme Q10 (CO Q 10 PO) Take 300 mg by mouth daily.      Marland Kitchen losartan (COZAAR) 100 MG tablet Take 1 tablet by mouth at bedtime.     . pravastatin (PRAVACHOL) 20 MG tablet Take 20 mg by mouth daily.     Marland Kitchen Respiratory Therapy Supplies (FLUTTER) DEVI Use as directed 1 each 0   . sodium chloride HYPERTONIC 3 % nebulizer solution Take 4 mLs by nebulization in the morning and at bedtime.     Marland Kitchen tiotropium (SPIRIVA) 18 MCG inhalation capsule Place 18 mcg  into inhaler and inhale daily.     . vitamin B-12 (CYANOCOBALAMIN) 1000 MCG tablet Take 1,000 mcg by mouth at bedtime.      No current facility-administered medications for this visit.   Allergies  Allergen Reactions  . Hydrocodone-Acetaminophen Nausea And Vomiting  . Wellbutrin [Bupropion] Nausea And Vomiting  . Zocor [Simvastatin]     Unknown    Social History   Tobacco Use  . Smoking status: Former Smoker    Packs/day: 1.50    Years: 46.00    Pack years: 69.00    Types: Cigarettes    Quit date: 06/24/2004    Years since quitting: 16.1  . Smokeless tobacco: Never Used  Substance Use Topics  . Alcohol use: Yes    Comment: 1-2  glasses of wine daily    Family History  Problem Relation Age of Onset  . Heart disease Father        valve replacement in the 60's  . Lymphoma Father   . Heart failure Father   . Uterine cancer Mother        with mets to lungs, was a smoker  . Leukemia Brother      Review of Systems  HENT: Positive for hearing loss and tinnitus.   Genitourinary: Positive for frequency.  Musculoskeletal: Positive for arthralgias.  Hematological: Bruises/bleeds easily.  All other systems reviewed and are negative.   Objective:  Physical Exam Constitutional:      General: He is not in acute distress.    Appearance: Normal appearance.  HENT:     Head: Normocephalic and atraumatic.  Eyes:     Extraocular Movements: Extraocular movements intact.     Pupils: Pupils are equal, round, and reactive to light.  Cardiovascular:     Rate and Rhythm: Normal rate and regular rhythm.     Pulses: Normal pulses.     Heart sounds: Normal heart sounds. No murmur heard.   Pulmonary:     Effort: Pulmonary effort is normal. No respiratory distress.     Breath sounds: Normal breath sounds.  Abdominal:     General: Abdomen is flat. Bowel sounds are normal. There is no distension.     Palpations: Abdomen is soft.     Tenderness: There is no abdominal tenderness.  Musculoskeletal:     Cervical back: Normal range of motion.     Left knee: Swelling and bony tenderness present. No effusion. Tenderness present.  Lymphadenopathy:     Cervical: No cervical adenopathy.  Skin:    General: Skin is warm and dry.     Findings: No erythema or rash.  Neurological:     General: No focal deficit present.     Mental Status: He is alert and oriented to person, place, and time.  Psychiatric:        Mood and Affect: Mood normal.        Behavior: Behavior normal.     Vital signs in last 24 hours: @VSRANGES @  Labs:   Estimated body mass index is 27.28 kg/m as calculated from the following:   Height as of  02/18/20: 5\' 8"  (1.727 m).   Weight as of 02/18/20: 81.4 kg.   Imaging Review Plain radiographs demonstrate moderate degenerative joint disease of the left knee(s). The overall alignment ismild varus. The bone quality appears to be good for age and reported activity level.      Assessment/Plan:  End stage arthritis, left knee   The patient history, physical examination, clinical judgment of the  provider and imaging studies are consistent with end stage degenerative joint disease of the left knee(s) and total knee arthroplasty is deemed medically necessary. The treatment options including medical management, injection therapy arthroscopy and arthroplasty were discussed at length. The risks and benefits of total knee arthroplasty were presented and reviewed. The risks due to aseptic loosening, infection, stiffness, patella tracking problems, thromboembolic complications and other imponderables were discussed. The patient acknowledged the explanation, agreed to proceed with the plan and consent was signed. Patient is being admitted for inpatient treatment for surgery, pain control, PT, OT, prophylactic antibiotics, VTE prophylaxis, progressive ambulation and ADL's and discharge planning. The patient is planning to be discharged home with home health services    Anticipated LOS equal to or greater than 2 midnights due to - Age 50 and older with one or more of the following:  - Obesity  - Expected need for hospital services (PT, OT, Nursing) required for safe  discharge  - Anticipated need for postoperative skilled nursing care or inpatient rehab  - Active co-morbidities: Diabetes, Coronary Artery Disease and Respiratory Failure/COPD OR   - Unanticipated findings during/Post Surgery: None  - Patient is a high risk of re-admission due to: None

## 2020-08-07 NOTE — Patient Instructions (Addendum)
DUE TO COVID-19 ONLY ONE VISITOR IS ALLOWED TO COME WITH YOU AND STAY IN THE WAITING ROOM ONLY DURING PRE OP AND PROCEDURE DAY OF SURGERY. THE 1 VISITOR  MAY VISIT WITH YOU AFTER SURGERY IN YOUR PRIVATE ROOM DURING VISITING HOURS ONLY!  YOU NEED TO HAVE A COVID 19 TEST ON_2/22______ @_10 :00______, THIS TEST MUST BE DONE BEFORE SURGERY,  COVID TESTING SITE [place] [address], IT IS ON THE RIGHT GOING OUT [address] APPROXIMATELY  2 MINUTES PAST [employer] ON THE RIGHT. ONCE YOUR COVID TEST IS COMPLETED,  PLEASE BEGIN THE QUARANTINE INSTRUCTIONS AS OUTLINED IN YOUR HANDOUT.                Dwana Curd   Your procedure is scheduled on: 08/18/20   Report to [facility] Main  Entrance   Report to Short Stay at 5:30  AM     Call this number if you have problems the morning of surgery [place], NO CHEWING GUM [place].   No food after midnight.    You may have clear liquid until 4:30 AM.    At 4:00 AM drink pre surgery drink.   Nothing by mouth after 4:30 AM .  Take these medicines the morning of surgery with A SIP OF WATER: None Use your inhaler and bring with you.                                 You may not have any metal on your body including              piercings  Do not wear jewelry,  lotions, powders or deodorant                        Men may shave face and neck.   Do not bring valuables to the hospital. [place].  Contacts, dentures or bridgework may not be worn into surgery.       Patients discharged the day of surgery will not be allowed to drive home  . IF YOU ARE HAVING SURGERY AND GOING HOME THE SAME DAY, YOU MUST HAVE AN ADULT TO DRIVE YOU HOME AND BE WITH YOU FOR 24 HOURS.  YOU MAY GO HOME BY TAXI OR UBER OR ORTHERWISE, BUT AN ADULT MUST ACCOMPANY YOU HOME AND STAY WITH YOU FOR 24  HOURS.  Name and phone number of your driver:  Special Instructions: N/A              Please read over the following fact sheets you were given: _____________________________________________________________________             [facility] - Preparing for Surgery Before surgery, you can play an important role.  Because skin is not sterile, your skin needs to be as free of germs as possible.  You can reduce the number of germs on your skin by washing with CHG (chlorahexidine gluconate) soap before surgery.  CHG is an antiseptic cleaner which kills germs and bonds with the skin to continue killing germs even after washing. Please DO NOT use if you have an allergy to CHG or antibacterial soaps.  If your  skin becomes reddened/irritated stop using the CHG and inform your nurse when you arrive at Short Stay.   You may shave your face/neck.  Please follow these instructions carefully:  1.  Shower with CHG Soap the night before surgery and the  morning of Surgery.  2.  If you choose to wash your hair, wash your hair first as usual with your  normal  shampoo.  3.  After you shampoo, rinse your hair and body thoroughly to remove the  shampoo.                                        4.  Use CHG as you would any other liquid soap.  You can apply chg directly  to the skin and wash                       Gently with a scrungie or clean washcloth.  5.  Apply the CHG Soap to your body ONLY FROM THE NECK DOWN.   Do not use on face/ open                           Wound or open sores. Avoid contact with eyes, ears mouth and genitals (private parts).                       Wash face,  Genitals (private parts) with your normal soap.             6.  Wash thoroughly, paying special attention to the area where your surgery  will be performed.  7.  Thoroughly rinse your body with warm water from the neck down.  8.  DO NOT shower/wash with your normal soap after using and rinsing off  the CHG Soap.             9.  Pat  yourself dry with a clean towel.            10.  Wear clean pajamas.            11.  Place clean sheets on your bed the night of your first shower and do not  sleep with pets. Day of Surgery : Do not apply any lotions/deodorants the morning of surgery.  Please wear clean clothes to the hospital/surgery center.  FAILURE TO FOLLOW THESE INSTRUCTIONS MAY RESULT IN THE CANCELLATION OF YOUR SURGERY PATIENT SIGNATURE_________________________________  NURSE SIGNATURE__________________________________  ________________________________________________________________________   Adam Phenix  An incentive spirometer is a tool that can help keep your lungs clear and active. This tool measures how well you are filling your lungs with each breath. Taking long deep breaths may help reverse or decrease the chance of developing breathing (pulmonary) problems (especially infection) following:  A long period of time when you are unable to move or be active. BEFORE THE PROCEDURE   If the spirometer includes an indicator to show your best effort, your nurse or respiratory therapist will set it to a desired goal.  If possible, sit up straight or lean slightly forward. Try not to slouch.  Hold the incentive spirometer in an upright position. INSTRUCTIONS FOR USE  1. Sit on the edge of your bed if possible, or sit up as far as you can in bed or on a chair. 2. Hold the incentive spirometer in an  upright position. 3. Breathe out normally. 4. Place the mouthpiece in your mouth and seal your lips tightly around it. 5. Breathe in slowly and as deeply as possible, raising the piston or the ball toward the top of the column. 6. Hold your breath for 3-5 seconds or for as long as possible. Allow the piston or ball to fall to the bottom of the column. 7. Remove the mouthpiece from your mouth and breathe out normally. 8. Rest for a few seconds and repeat Steps 1 through 7 at least 10 times every 1-2 hours  when you are awake. Take your time and take a few normal breaths between deep breaths. 9. The spirometer may include an indicator to show your best effort. Use the indicator as a goal to work toward during each repetition. 10. After each set of 10 deep breaths, practice coughing to be sure your lungs are clear. If you have an incision (the cut made at the time of surgery), support your incision when coughing by placing a pillow or rolled up towels firmly against it. Once you are able to get out of bed, walk around indoors and cough well. You may stop using the incentive spirometer when instructed by your caregiver.  RISKS AND COMPLICATIONS  Take your time so you do not get dizzy or light-headed.  If you are in pain, you may need to take or ask for pain medication before doing incentive spirometry. It is harder to take a deep breath if you are having pain. AFTER USE  Rest and breathe slowly and easily.  It can be helpful to keep track of a log of your progress. Your caregiver can provide you with a simple table to help with this. If you are using the spirometer at home, follow these instructions: [place] IF:   You are having difficultly using the spirometer.  You have trouble using the spirometer as often as instructed.  Your pain medication is not giving enough relief while using the spirometer.  You develop fever of 100.5 F (38.1 C) or higher. SEEK IMMEDIATE MEDICAL CARE IF:   You cough up bloody sputum that had not been present before.  You develop fever of 102 F (38.9 C) or greater.  You develop worsening pain at or near the incision site. MAKE SURE YOU:   Understand these instructions.  Will watch your condition.  Will get help right away if you are not doing well or get worse. Document Released: 10/21/2006 Document Revised: 09/02/2011 Document Reviewed: 12/22/2006 [facility] Patient Information 2014 [place].   ________________________________________________________________________ 10:00

## 2020-08-08 ENCOUNTER — Encounter (HOSPITAL_COMMUNITY)
Admission: RE | Admit: 2020-08-08 | Discharge: 2020-08-08 | Disposition: A | Discharge status: Home / Self Care | Payer: Medicare PPO | Source: Ambulatory Visit | Attending: Orthopedic Surgery | Admitting: Orthopedic Surgery

## 2020-08-08 ENCOUNTER — Encounter (HOSPITAL_COMMUNITY): Payer: Self-pay

## 2020-08-08 ENCOUNTER — Other Ambulatory Visit: Payer: Self-pay

## 2020-08-08 DIAGNOSIS — Z01812 Encounter for preprocedural laboratory examination: Secondary | ICD-10-CM | POA: Insufficient documentation

## 2020-08-08 HISTORY — DX: Prediabetes: R73.03

## 2020-08-08 HISTORY — DX: Atherosclerotic heart disease of native coronary artery without angina pectoris: I25.10

## 2020-08-08 HISTORY — DX: Dyspnea, unspecified: R06.00

## 2020-08-08 HISTORY — DX: Palmar fascial fibromatosis (dupuytren): M72.0

## 2020-08-08 HISTORY — DX: Type 2 diabetes mellitus without complications: E11.9

## 2020-08-08 LAB — CBC WITH DIFFERENTIAL/PLATELET
Abs Immature Granulocytes: 0.05 10*3/uL (ref 0.00–0.07)
Basophils Absolute: 0.1 10*3/uL (ref 0.0–0.1)
Basophils Relative: 1 %
Eosinophils Absolute: 0.1 10*3/uL (ref 0.0–0.5)
Eosinophils Relative: 1 %
HCT: 46.6 % (ref 39.0–52.0)
Hemoglobin: 16 g/dL (ref 13.0–17.0)
Immature Granulocytes: 1 %
Lymphocytes Relative: 23 %
Lymphs Abs: 2.1 10*3/uL (ref 0.7–4.0)
MCH: 32.5 pg (ref 26.0–34.0)
MCHC: 34.3 g/dL (ref 30.0–36.0)
MCV: 94.5 fL (ref 80.0–100.0)
Monocytes Absolute: 0.9 10*3/uL (ref 0.1–1.0)
Monocytes Relative: 10 %
Neutro Abs: 6.2 10*3/uL (ref 1.7–7.7)
Neutrophils Relative %: 64 %
Platelets: 322 10*3/uL (ref 150–400)
RBC: 4.93 MIL/uL (ref 4.22–5.81)
RDW: 12.7 % (ref 11.5–15.5)
WBC: 9.4 10*3/uL (ref 4.0–10.5)
nRBC: 0 % (ref 0.0–0.2)

## 2020-08-08 LAB — PROTIME-INR
INR: 1.1 (ref 0.8–1.2)
Prothrombin Time: 13.4 seconds (ref 11.4–15.2)

## 2020-08-08 LAB — COMPREHENSIVE METABOLIC PANEL
ALT: 21 U/L (ref 0–44)
AST: 23 U/L (ref 15–41)
Albumin: 4.3 g/dL (ref 3.5–5.0)
Alkaline Phosphatase: 99 U/L (ref 38–126)
Anion gap: 11 (ref 5–15)
BUN: 14 mg/dL (ref 8–23)
CO2: 27 mmol/L (ref 22–32)
Calcium: 9.8 mg/dL (ref 8.9–10.3)
Chloride: 104 mmol/L (ref 98–111)
Creatinine, Ser: 0.79 mg/dL (ref 0.61–1.24)
GFR, Estimated: >60 mL/min (ref >60)
Glucose, Bld: 103 mg/dL — ABNORMAL HIGH (ref 70–99)
Potassium: 4.7 mmol/L (ref 3.5–5.1)
Sodium: 142 mmol/L (ref 135–145)
Total Bilirubin: 0.8 mg/dL (ref 0.3–1.2)
Total Protein: 7.9 g/dL (ref 6.5–8.1)

## 2020-08-08 LAB — SURGICAL PCR SCREEN
MRSA, PCR: NEGATIVE
Staphylococcus aureus: NEGATIVE

## 2020-08-08 LAB — APTT: aPTT: 32 seconds (ref 24–36)

## 2020-08-08 NOTE — Care Plan (Signed)
Ortho Bundle Case Management Note  Patient Details  Name: [name] MRN: [id number] Date of Birth: 1940-11-03  Spoke with patient prior to surgery. He will discharge to home with family to assist. Rolling walker and CPM ordered for home use. HHPT referral to [facility] and OPPT set up with [place]. Patient and MD in agreement with plan and choice offered                    DME Arranged:  Gilford Rile rolling,CPM DME Agency:  [facility]  HH Arranged:  [place] Agency:  [facility] (formerly [facility])  Additional Comments: Please contact me with any questions of if this plan should need to change.  Ladell Heads,  [place] [facility] Specialist  [phone] 08/08/2020, 9:14 AM

## 2020-08-08 NOTE — Progress Notes (Signed)
COVID Vaccine Completed:Yes Date COVID Vaccine completed:07/28/19 -booster 03/24/20 COVID vaccine manufacturer: Pfizer     PCP - Dr. Lawernce Pitts Cardiologist - Dr. Irish Lack  Chest x-ray - no EKG - 02/18/20-epic Stress Test -no  ECHO - 12/31/19-epic Cardiac Cath - no Pacemaker/ICD device last checked:NA  Sleep Study - no CPAP -   Fasting Blood Sugar - NA Checks Blood Sugar _____ times a day  Blood Thinner Instructions:ASA81/Dr. Little Aspirin Instructions:Stop 7 days prior to DOS/ Dr. French Ana Last Dose:08/10/20  Anesthesia review:   Patient denies shortness of breath, fever, cough and chest pain at PAT appointment  yes Patient verbalized understanding of instructions that were given to them at the PAT appointment. Patient was also instructed that they will need to review over the PAT instructions again at home before surgery. Yes. Pt states that he climbs steps one at a time.He does get SOB with some activity but not with ADLs

## 2020-08-09 LAB — HEMOGLOBIN A1C
Hgb A1c MFr Bld: 6.1 % — ABNORMAL HIGH (ref 4.8–5.6)
Mean Plasma Glucose: 128 mg/dL

## 2020-08-15 ENCOUNTER — Other Ambulatory Visit (HOSPITAL_COMMUNITY)
Admission: RE | Admit: 2020-08-15 | Discharge: 2020-08-15 | Disposition: A | Discharge status: Home / Self Care | Payer: Medicare PPO | Source: Ambulatory Visit | Attending: Orthopedic Surgery | Admitting: Orthopedic Surgery

## 2020-08-15 DIAGNOSIS — Z20822 Contact with and (suspected) exposure to covid-19: Secondary | ICD-10-CM | POA: Insufficient documentation

## 2020-08-15 DIAGNOSIS — Z01812 Encounter for preprocedural laboratory examination: Secondary | ICD-10-CM | POA: Insufficient documentation

## 2020-08-15 LAB — SARS CORONAVIRUS 2 (TAT 6-24 HRS): SARS Coronavirus 2: NEGATIVE

## 2020-08-17 MED ORDER — BUPIVACAINE LIPOSOME 1.3 % IJ SUSP
20.0000 mL | Freq: Once | INTRAMUSCULAR | Status: DC
  Filled 2020-08-17: qty 20

## 2020-08-17 MED ORDER — TRANEXAMIC ACID 1000 MG/10ML IV SOLN
2000.0000 mg | INTRAVENOUS | Status: DC
  Filled 2020-08-17: qty 20

## 2020-08-17 NOTE — Anesthesia Preprocedure Evaluation (Addendum)
Anesthesia Evaluation  Patient identified by MRN, date of birth, ID band Patient awake    Reviewed: Allergy & Precautions, H&P , NPO status , Patient's Chart, lab work & pertinent test results  Airway Mallampati: II  TM Distance: >3 FB Neck ROM: Full    Dental no notable dental hx.    Pulmonary shortness of breath and with exertion, COPD,  COPD inhaler, former smoker,    Pulmonary exam normal  + decreased breath sounds      Cardiovascular hypertension, Pt. on medications + Peripheral Vascular Disease  Normal cardiovascular exam Rhythm:Regular Rate:Normal     Neuro/Psych negative neurological ROS  negative psych ROS   GI/Hepatic negative GI ROS, Neg liver ROS,   Endo/Other  negative endocrine ROS  Renal/GU negative Renal ROS  negative genitourinary   Musculoskeletal negative musculoskeletal ROS (+)   Abdominal   Peds negative pediatric ROS (+)  Hematology negative hematology ROS (+)   Anesthesia Other Findings   Reproductive/Obstetrics negative OB ROS                            Anesthesia Physical Anesthesia Plan  ASA: III  Anesthesia Plan: Spinal   Post-op Pain Management:  Regional for Post-op pain   Induction: Intravenous  PONV Risk Score and Plan: 2 and Ondansetron, Dexamethasone and Treatment may vary due to age or medical condition  Airway Management Planned: Simple Face Mask  Additional Equipment:   Intra-op Plan:   Post-operative Plan:   Informed Consent: I have reviewed the patients History and Physical, chart, labs and discussed the procedure including the risks, benefits and alternatives for the proposed anesthesia with the patient or authorized representative who has indicated his/her understanding and acceptance.     Dental advisory given  Plan Discussed with: CRNA and Surgeon  Anesthesia Plan Comments:        Anesthesia Quick Evaluation

## 2020-08-18 ENCOUNTER — Ambulatory Visit (HOSPITAL_COMMUNITY): Payer: Medicare PPO | Admitting: Anesthesiology

## 2020-08-18 ENCOUNTER — Ambulatory Visit (HOSPITAL_COMMUNITY)
Admission: RE | Admit: 2020-08-18 | Discharge: 2020-08-18 | Disposition: A | Discharge status: Home / Self Care | Payer: Medicare PPO | Source: Ambulatory Visit | Attending: Orthopedic Surgery | Admitting: Orthopedic Surgery

## 2020-08-18 ENCOUNTER — Encounter (HOSPITAL_COMMUNITY): Payer: Self-pay | Admitting: Orthopedic Surgery

## 2020-08-18 ENCOUNTER — Encounter (HOSPITAL_COMMUNITY)
Admission: RE | Disposition: A | Discharge status: Home / Self Care | Payer: Self-pay | Source: Ambulatory Visit | Attending: Orthopedic Surgery

## 2020-08-18 DIAGNOSIS — Z87891 Personal history of nicotine dependence: Secondary | ICD-10-CM | POA: Insufficient documentation

## 2020-08-18 DIAGNOSIS — Z79899 Other long term (current) drug therapy: Secondary | ICD-10-CM | POA: Diagnosis not present

## 2020-08-18 DIAGNOSIS — Z7982 Long term (current) use of aspirin: Secondary | ICD-10-CM | POA: Diagnosis not present

## 2020-08-18 DIAGNOSIS — I10 Essential (primary) hypertension: Secondary | ICD-10-CM | POA: Diagnosis not present

## 2020-08-18 DIAGNOSIS — M21962 Unspecified acquired deformity of left lower leg: Secondary | ICD-10-CM | POA: Insufficient documentation

## 2020-08-18 DIAGNOSIS — M24562 Contracture, left knee: Secondary | ICD-10-CM | POA: Insufficient documentation

## 2020-08-18 DIAGNOSIS — M1712 Unilateral primary osteoarthritis, left knee: Secondary | ICD-10-CM | POA: Insufficient documentation

## 2020-08-18 DIAGNOSIS — Z96652 Presence of left artificial knee joint: Secondary | ICD-10-CM | POA: Diagnosis not present

## 2020-08-18 DIAGNOSIS — Z8546 Personal history of malignant neoplasm of prostate: Secondary | ICD-10-CM | POA: Insufficient documentation

## 2020-08-18 DIAGNOSIS — G8918 Other acute postprocedural pain: Secondary | ICD-10-CM | POA: Diagnosis not present

## 2020-08-18 DIAGNOSIS — J449 Chronic obstructive pulmonary disease, unspecified: Secondary | ICD-10-CM | POA: Diagnosis not present

## 2020-08-18 HISTORY — PX: TOTAL KNEE ARTHROPLASTY: SHX125

## 2020-08-18 LAB — TYPE AND SCREEN
ABO/RH(D): B POS
Antibody Screen: NEGATIVE

## 2020-08-18 SURGERY — ARTHROPLASTY, KNEE, TOTAL
Anesthesia: Spinal | Site: Knee | Laterality: Left

## 2020-08-18 MED ORDER — 0.9 % SODIUM CHLORIDE (POUR BTL) OPTIME
TOPICAL | Status: DC | PRN
  Administered 2020-08-18: 1000 mL

## 2020-08-18 MED ORDER — LACTATED RINGERS IV BOLUS
500.0000 mL | Freq: Once | INTRAVENOUS | Status: AC
  Administered 2020-08-18: 500 mL via INTRAVENOUS

## 2020-08-18 MED ORDER — ASPIRIN 81 MG PO TABS: 81.0000 mg | ORAL_TABLET | Freq: Two times a day (BID) | ORAL | 0 refills | Status: AC

## 2020-08-18 MED ORDER — PHENYLEPHRINE HCL (PRESSORS) 10 MG/ML IV SOLN
INTRAVENOUS | Status: AC
  Filled 2020-08-18: qty 1

## 2020-08-18 MED ORDER — BUPIVACAINE-EPINEPHRINE (PF) 0.25% -1:200000 IJ SOLN
INTRAMUSCULAR | Status: AC
  Filled 2020-08-18: qty 30

## 2020-08-18 MED ORDER — ONDANSETRON HCL 4 MG/2ML IJ SOLN
INTRAMUSCULAR | Status: DC | PRN
  Administered 2020-08-18: 4 mg via INTRAVENOUS

## 2020-08-18 MED ORDER — CEFAZOLIN SODIUM-DEXTROSE 2-4 GM/100ML-% IV SOLN
2.0000 g | INTRAVENOUS | Status: AC
  Administered 2020-08-18: 2 g via INTRAVENOUS
  Filled 2020-08-18: qty 100

## 2020-08-18 MED ORDER — DEXAMETHASONE SODIUM PHOSPHATE 10 MG/ML IJ SOLN
INTRAMUSCULAR | Status: DC | PRN
  Administered 2020-08-18: 8 mg via INTRAVENOUS

## 2020-08-18 MED ORDER — POVIDONE-IODINE 10 % EX SWAB
2.0000 "application " | Freq: Once | CUTANEOUS | Status: AC
  Administered 2020-08-18: 2 via TOPICAL

## 2020-08-18 MED ORDER — METOCLOPRAMIDE HCL 5 MG PO TABS
5.0000 mg | ORAL_TABLET | Freq: Three times a day (TID) | ORAL | Status: DC | PRN
  Filled 2020-08-18: qty 2

## 2020-08-18 MED ORDER — ORAL CARE MOUTH RINSE: 15.0000 mL | Freq: Once | OROMUCOSAL | Status: AC

## 2020-08-18 MED ORDER — TRANEXAMIC ACID 1000 MG/10ML IV SOLN
INTRAVENOUS | Status: DC | PRN
  Administered 2020-08-18: 2000 mg via TOPICAL

## 2020-08-18 MED ORDER — ONDANSETRON HCL 4 MG PO TABS
4.0000 mg | ORAL_TABLET | Freq: Four times a day (QID) | ORAL | Status: DC
  Filled 2020-08-18: qty 1

## 2020-08-18 MED ORDER — LACTATED RINGERS IV BOLUS: 250.0000 mL | Freq: Once | INTRAVENOUS | Status: DC

## 2020-08-18 MED ORDER — OXYCODONE HCL 5 MG PO TABS
5.0000 mg | ORAL_TABLET | ORAL | Status: DC | PRN
  Administered 2020-08-18: 5 mg via ORAL

## 2020-08-18 MED ORDER — ONDANSETRON HCL 4 MG/2ML IJ SOLN
INTRAMUSCULAR | Status: AC
  Filled 2020-08-18: qty 2

## 2020-08-18 MED ORDER — ACETAMINOPHEN 325 MG PO TABS: 325.0000 mg | ORAL_TABLET | Freq: Four times a day (QID) | ORAL | Status: DC | PRN

## 2020-08-18 MED ORDER — BUPIVACAINE HCL (PF) 0.5 % IJ SOLN
INTRAMUSCULAR | Status: DC | PRN
  Administered 2020-08-18: 20 mL via PERINEURAL

## 2020-08-18 MED ORDER — PROPOFOL 500 MG/50ML IV EMUL
INTRAVENOUS | Status: DC | PRN
  Administered 2020-08-18: 100 ug/kg/min via INTRAVENOUS

## 2020-08-18 MED ORDER — MIDAZOLAM HCL 2 MG/2ML IJ SOLN
INTRAMUSCULAR | Status: DC | PRN
  Administered 2020-08-18 (×2): 1 mg via INTRAVENOUS

## 2020-08-18 MED ORDER — PROPOFOL 1000 MG/100ML IV EMUL
INTRAVENOUS | Status: AC
  Filled 2020-08-18: qty 100

## 2020-08-18 MED ORDER — DEXAMETHASONE SODIUM PHOSPHATE 10 MG/ML IJ SOLN
INTRAMUSCULAR | Status: AC
  Filled 2020-08-18: qty 1

## 2020-08-18 MED ORDER — PHENYLEPHRINE 40 MCG/ML (10ML) SYRINGE FOR IV PUSH (FOR BLOOD PRESSURE SUPPORT)
PREFILLED_SYRINGE | INTRAVENOUS | Status: AC
  Filled 2020-08-18: qty 10

## 2020-08-18 MED ORDER — ONDANSETRON HCL 4 MG PO TABS: 4.0000 mg | ORAL_TABLET | Freq: Four times a day (QID) | ORAL | 1 refills | Status: AC | PRN

## 2020-08-18 MED ORDER — HYDROMORPHONE HCL 1 MG/ML IJ SOLN: 0.2500 mg | INTRAMUSCULAR | Status: DC | PRN

## 2020-08-18 MED ORDER — OXYCODONE HCL 5 MG PO TABS
ORAL_TABLET | ORAL | Status: AC
  Filled 2020-08-18: qty 1

## 2020-08-18 MED ORDER — LIDOCAINE HCL (PF) 2 % IJ SOLN
INTRAMUSCULAR | Status: DC | PRN
  Administered 2020-08-18: 40 mg via INTRADERMAL

## 2020-08-18 MED ORDER — TRANEXAMIC ACID-NACL 1000-0.7 MG/100ML-% IV SOLN: 1000.0000 mg | Freq: Once | INTRAVENOUS | Status: DC

## 2020-08-18 MED ORDER — OXYCODONE HCL 5 MG PO TABS: ORAL_TABLET | ORAL | 0 refills | Status: AC

## 2020-08-18 MED ORDER — ACETAMINOPHEN 500 MG PO TABS: 1000.0000 mg | ORAL_TABLET | Freq: Four times a day (QID) | ORAL | Status: DC

## 2020-08-18 MED ORDER — PHENYLEPHRINE HCL-NACL 10-0.9 MG/250ML-% IV SOLN
INTRAVENOUS | Status: DC | PRN
  Administered 2020-08-18: 30 ug/min via INTRAVENOUS

## 2020-08-18 MED ORDER — PHENYLEPHRINE 40 MCG/ML (10ML) SYRINGE FOR IV PUSH (FOR BLOOD PRESSURE SUPPORT)
PREFILLED_SYRINGE | INTRAVENOUS | Status: DC | PRN
  Administered 2020-08-18: 120 ug via INTRAVENOUS

## 2020-08-18 MED ORDER — TRANEXAMIC ACID-NACL 1000-0.7 MG/100ML-% IV SOLN
1000.0000 mg | INTRAVENOUS | Status: AC
  Administered 2020-08-18: 1000 mg via INTRAVENOUS
  Filled 2020-08-18: qty 100

## 2020-08-18 MED ORDER — BUPIVACAINE LIPOSOME 1.3 % IJ SUSP
INTRAMUSCULAR | Status: DC | PRN
  Administered 2020-08-18: 20 mL

## 2020-08-18 MED ORDER — WATER FOR IRRIGATION, STERILE IR SOLN
Status: DC | PRN
  Administered 2020-08-18: 2000 mL

## 2020-08-18 MED ORDER — HYDROMORPHONE HCL 1 MG/ML IJ SOLN: 0.5000 mg | INTRAMUSCULAR | Status: DC | PRN

## 2020-08-18 MED ORDER — ONDANSETRON HCL 4 MG/2ML IJ SOLN: 4.0000 mg | Freq: Four times a day (QID) | INTRAMUSCULAR | Status: DC

## 2020-08-18 MED ORDER — CHLORHEXIDINE GLUCONATE 0.12 % MT SOLN
15.0000 mL | Freq: Once | OROMUCOSAL | Status: AC
  Administered 2020-08-18: 15 mL via OROMUCOSAL

## 2020-08-18 MED ORDER — BUPIVACAINE-EPINEPHRINE (PF) 0.25% -1:200000 IJ SOLN
INTRAMUSCULAR | Status: DC | PRN
  Administered 2020-08-18: 30 mL

## 2020-08-18 MED ORDER — BUPIVACAINE IN DEXTROSE 0.75-8.25 % IT SOLN
INTRATHECAL | Status: DC | PRN
  Administered 2020-08-18: 1.4 mL via INTRATHECAL

## 2020-08-18 MED ORDER — PROPOFOL 10 MG/ML IV BOLUS
INTRAVENOUS | Status: DC | PRN
  Administered 2020-08-18 (×2): 20 mg via INTRAVENOUS

## 2020-08-18 MED ORDER — MIDAZOLAM HCL 2 MG/2ML IJ SOLN
INTRAMUSCULAR | Status: AC
  Filled 2020-08-18: qty 2

## 2020-08-18 MED ORDER — FENTANYL CITRATE (PF) 100 MCG/2ML IJ SOLN
INTRAMUSCULAR | Status: AC
  Filled 2020-08-18: qty 2

## 2020-08-18 MED ORDER — LACTATED RINGERS IV SOLN: INTRAVENOUS | Status: DC

## 2020-08-18 MED ORDER — SODIUM CHLORIDE 0.9 % IV SOLN: INTRAVENOUS | Status: DC

## 2020-08-18 MED ORDER — LIDOCAINE HCL (PF) 2 % IJ SOLN
INTRAMUSCULAR | Status: AC
  Filled 2020-08-18: qty 5

## 2020-08-18 MED ORDER — SODIUM CHLORIDE (PF) 0.9 % IJ SOLN
INTRAMUSCULAR | Status: DC | PRN
  Administered 2020-08-18: 50 mL

## 2020-08-18 MED ORDER — PROPOFOL 10 MG/ML IV BOLUS
INTRAVENOUS | Status: AC
  Filled 2020-08-18: qty 20

## 2020-08-18 MED ORDER — FENTANYL CITRATE (PF) 100 MCG/2ML IJ SOLN
INTRAMUSCULAR | Status: DC | PRN
  Administered 2020-08-18: 50 ug via INTRAVENOUS

## 2020-08-18 MED ORDER — METOCLOPRAMIDE HCL 5 MG/ML IJ SOLN: 5.0000 mg | Freq: Three times a day (TID) | INTRAMUSCULAR | Status: DC | PRN

## 2020-08-18 SURGICAL SUPPLY — 64 items
ANCH SUT 2 CP-2 EBND QANCHR+ (Anchor) ×1 IMPLANT
ANCHOR SUPER QUICK (Anchor) ×2 IMPLANT
ATTUNE MED DOME PAT 38 KNEE (Knees) ×2 IMPLANT
ATTUNE PS FEM LT SZ 6 CEM KNEE (Femur) ×2 IMPLANT
ATTUNE PSRP INSR SZ6 5 KNEE (Insert) ×2 IMPLANT
BAG DECANTER FOR FLEXI CONT (MISCELLANEOUS) ×2 IMPLANT
BAG SPEC THK2 15X12 ZIP CLS (MISCELLANEOUS) ×1
BAG ZIPLOCK 12X15 (MISCELLANEOUS) ×2 IMPLANT
BASE TIBIAL ROT PLAT SZ 7 KNEE (Knees) ×1 IMPLANT
BENZOIN TINCTURE PRP APPL 2/3 (GAUZE/BANDAGES/DRESSINGS) ×2 IMPLANT
BLADE SAGITTAL 25.0X1.19X90 (BLADE) ×2 IMPLANT
BLADE SAW SGTL 13X75X1.27 (BLADE) ×2 IMPLANT
BLADE SURG 15 STRL LF DISP TIS (BLADE) ×1 IMPLANT
BLADE SURG 15 STRL SS (BLADE) ×2
BLADE SURG SZ10 CARB STEEL (BLADE) ×6 IMPLANT
BNDG CMPR MED 15X6 ELC VLCR LF (GAUZE/BANDAGES/DRESSINGS) ×1
BNDG ELASTIC 6X15 VLCR STRL LF (GAUZE/BANDAGES/DRESSINGS) ×2 IMPLANT
BOWL SMART MIX CTS (DISPOSABLE) ×2 IMPLANT
BSPLAT TIB 7 CMNT ROT PLAT STR (Knees) ×1 IMPLANT
CEMENT HV SMART SET (Cement) ×4 IMPLANT
CLSR STERI-STRIP ANTIMIC 1/2X4 (GAUZE/BANDAGES/DRESSINGS) ×4 IMPLANT
COVER SURGICAL LIGHT HANDLE (MISCELLANEOUS) ×2 IMPLANT
COVER WAND RF STERILE (DRAPES) ×2 IMPLANT
CUFF TOURN SGL QUICK 34 (TOURNIQUET CUFF) ×2
CUFF TRNQT CYL 34X4.125X (TOURNIQUET CUFF) ×1 IMPLANT
DECANTER SPIKE VIAL GLASS SM (MISCELLANEOUS) ×2 IMPLANT
DRAPE INCISE IOBAN 66X45 STRL (DRAPES) IMPLANT
DRAPE ORTHO SPLIT 77X108 STRL (DRAPES) ×4
DRAPE SURG ORHT 6 SPLT 77X108 (DRAPES) ×2 IMPLANT
DRAPE U-SHAPE 47X51 STRL (DRAPES) ×2 IMPLANT
DRESSING AQUACEL AG SP 3.5X10 (GAUZE/BANDAGES/DRESSINGS) ×1 IMPLANT
DRSG AQUACEL AG SP 3.5X10 (GAUZE/BANDAGES/DRESSINGS) ×2
DURAPREP 26ML APPLICATOR (WOUND CARE) ×4 IMPLANT
ELECT REM PT RETURN 15FT ADLT (MISCELLANEOUS) ×2 IMPLANT
GLOVE SRG 8 PF TXTR STRL LF DI (GLOVE) ×2 IMPLANT
GLOVE SURG ORTHO LTX SZ8 (GLOVE) ×2 IMPLANT
GLOVE SURG SS PI 7.5 STRL IVOR (GLOVE) ×2 IMPLANT
GLOVE SURG UNDER POLY LF SZ8 (GLOVE) ×4
GOWN STRL REUS W/TWL XL LVL3 (GOWN DISPOSABLE) ×4 IMPLANT
HANDPIECE INTERPULSE COAX TIP (DISPOSABLE) ×2
HOLDER FOLEY CATH W/STRAP (MISCELLANEOUS) ×2 IMPLANT
HOOD PEEL AWAY FLYTE STAYCOOL (MISCELLANEOUS) ×4 IMPLANT
IMMOBILIZER KNEE 20 (SOFTGOODS) ×2
IMMOBILIZER KNEE 20 THIGH 36 (SOFTGOODS) ×1 IMPLANT
KIT TURNOVER KIT A (KITS) ×2 IMPLANT
MANIFOLD NEPTUNE II (INSTRUMENTS) ×2 IMPLANT
NEEDLE HYPO 22GX1.5 SAFETY (NEEDLE) ×4 IMPLANT
PACK ICE MAXI GEL EZY WRAP (MISCELLANEOUS) ×2 IMPLANT
PACK TOTAL KNEE CUSTOM (KITS) ×2 IMPLANT
PENCIL SMOKE EVACUATOR (MISCELLANEOUS) ×2 IMPLANT
PIN DRILL FIX HALF THREAD (BIT) ×2 IMPLANT
PIN STEINMAN FIXATION KNEE (PIN) ×2 IMPLANT
PROTECTOR NERVE ULNAR (MISCELLANEOUS) ×2 IMPLANT
SET HNDPC FAN SPRY TIP SCT (DISPOSABLE) ×1 IMPLANT
SUT ETHIBOND NAB CT1 #1 30IN (SUTURE) ×4 IMPLANT
SUT MNCRL AB 3-0 PS2 18 (SUTURE) ×2 IMPLANT
SUT VIC AB 0 CT1 36 (SUTURE) ×2 IMPLANT
SUT VIC AB 2-0 CT1 27 (SUTURE) ×4
SUT VIC AB 2-0 CT1 TAPERPNT 27 (SUTURE) ×2 IMPLANT
SYR CONTROL 10ML LL (SYRINGE) ×6 IMPLANT
TIBIAL BASE ROT PLAT SZ 7 KNEE (Knees) ×2 IMPLANT
TOWEL OR 17X26 10 PK STRL BLUE (TOWEL DISPOSABLE) ×2 IMPLANT
TRAY FOLEY MTR SLVR 16FR STAT (SET/KITS/TRAYS/PACK) ×2 IMPLANT
WRAP KNEE MAXI GEL POST OP (GAUZE/BANDAGES/DRESSINGS) ×2 IMPLANT

## 2020-08-18 NOTE — Transfer of Care (Signed)
Immediate Anesthesia Transfer of Care Note  Patient: [name]  Procedure(s) Performed: TOTAL KNEE ARTHROPLASTY (Left Knee)  Patient Location: PACU  Anesthesia Type:Spinal  Level of Consciousness: awake, alert  and oriented  Airway & Oxygen Therapy: Patient Spontanous Breathing and Patient connected to face mask oxygen  Post-op Assessment: Report given to RN and Post -op Vital signs reviewed and stable  Post vital signs: Reviewed and stable  Last Vitals:  Vitals Value Taken Time  BP    Temp    Pulse 74 08/18/20 0959  Resp 20 08/18/20 0959  SpO2 97 % 08/18/20 0959  Vitals shown include unvalidated device data.  Last Pain:  Vitals:   08/18/20 0606  TempSrc:   PainSc: 3       Patients Stated Pain Goal: 2 (89/79/15 0413)  Complications: No complications documented.

## 2020-08-18 NOTE — Anesthesia Procedure Notes (Signed)
Anesthesia Regional Block: Adductor canal block   Pre-Anesthetic Checklist: ,, timeout performed, Correct Patient, Correct Site, Correct Laterality, Correct Procedure, Correct Position, site marked, Risks and benefits discussed,  Surgical consent,  Pre-op evaluation,  At surgeon's request and post-op pain management  Laterality: Left  Prep: chloraprep       Needles:  Injection technique: Single-shot  Needle Type: Echogenic Needle     Needle Length: 9cm      Additional Needles:   Procedures:,,,, ultrasound used (permanent image in chart),,,,  Narrative:  Start time: 08/18/2020 6:58 AM End time: 08/18/2020 7:05 AM Injection made incrementally with aspirations every 5 mL.  Performed by: Personally  Anesthesiologist: Myrtie Soman, MD  Additional Notes: Patient tolerated the procedure well without complications

## 2020-08-18 NOTE — Progress Notes (Signed)
Orthopedic Tech Progress Note Patient Details:  [name] Nov 18, 1940 [id number]  Ortho Devices Type of Ortho Device: Bone foam zero knee Ortho Device/Splint Location: LLE Ortho Device/Splint Interventions: Ordered   Post Interventions Instructions Provided: Care of device   [employer] 08/18/2020, 11:40 AM

## 2020-08-18 NOTE — Brief Op Note (Signed)
08/18/2020  9:53 AM  PATIENT:  Dwana Curd  80 y.o. male  PRE-OPERATIVE DIAGNOSIS:  djd left knee  POST-OPERATIVE DIAGNOSIS:  djd left knee  PROCEDURE:  Procedure(s): TOTAL KNEE ARTHROPLASTY (Left)  SURGEON:  Surgeon(s) and Role:    Earlie Server, MD - Primary  PHYSICIAN ASSISTANT: Chriss Czar, PA-C  ASSISTANTS: OR staff x1   ANESTHESIA:   local, regional, spinal and IV sedation  EBL:  50 mL   BLOOD ADMINISTERED:none  DRAINS: none   LOCAL MEDICATIONS USED:  MARCAINE     SPECIMEN:  No Specimen  DISPOSITION OF SPECIMEN:  N/A  COUNTS:  YES  TOURNIQUET:   Total Tourniquet Time Documented: Thigh (Left) - 62 minutes Total: Thigh (Left) - 62 minutes   DICTATION: .Other Dictation: Dictation Number unknown  PLAN OF CARE: Discharge to home after PACU  PATIENT DISPOSITION:  PACU - hemodynamically stable.   Delay start of Pharmacological VTE agent (>24hrs) due to surgical blood loss or risk of bleeding: yes

## 2020-08-18 NOTE — Op Note (Signed)
NAME: [name], [name] MEDICAL RECORD NO: [id number] ACCOUNT NO: [id number] DATE OF BIRTH: 27-Nov-1940 FACILITY: [facility] LOCATION: [facility] PHYSICIAN: W D. Valeta Harms., MD  Operative Report   DATE OF PROCEDURE: 08/18/2020  PREOPERATIVE DIAGNOSIS:  Severe osteoarthritis, left knee with varus deformity, flexion contracture.  POSTOPERATIVE DIAGNOSIS:  Severe osteoarthritis, left knee with varus deformity, flexion contracture.  PROCEDURE:  Left total knee (DePuy Attune knee, size 6 femur, 7 tibia, 6 tibial bearing, 5 mm thickness with 38 mm all poly patella.  SURGEON:  W D. Valeta Harms., MD  ASSISTANTMarjo Bicker.  TOURNIQUET TIME:  16 minutes.  Spinal with local.  DESCRIPTION OF THE PROCEDURE:  In supine position, exsanguination of leg, inflation of the thigh tourniquet 350.  Straight skin incision with medial parapatellar approach to the knee made.  We did moderate stripping of the medial side for releasing the  MCL.  We then did a 5-degree 10 mm cut on the distal femur, followed by cutting approximately 3 mm below the most diseased medial compartment.  Extension gap measured at 5 mm.  Placement of all-in-one cutting block on the femur with appropriate degree of  external rotation and accomplishing the anterior and posterior chamfer cuts.  The PCL was released as well as excision of a portion of the remnants of the meniscus and a large osteophyte from the posteromedial femur.  Tibia was sized to be a size 7 and  the femur 6, patella was cut, resecting 9.5 mm of patella, 38 mm all poly trial.  All parameters were acceptable relative to the flexion contracture and varus deformity being resolved with excellent balance of the ligaments and no tendency for bearing  spinout or dislocation.  Final prosthesis was inserted.  Tibia followed by femur, patella.  Cement was allowed to harden with the trial bearing in.  Trial bearing was removed, no excess cement was noted.  Tourniquet was released.  No  significant bleeding  was noted.  Final bearing was placed.  Mixture of Marcaine, Exparel was infiltrated in the subcutaneous tissues as well as the capsular structures.  Closure was effected with #1 Ethibond.  The patient did not have an avulsion, but the attachment of the  patellar tendon was slightly attenuated and we elected to place a Mitek anchor at the attachment site as "ensure and stitch." Manual closure with Vicryl and Monocryl in the skin.  Taken to recovery room in stable condition.   PUS D: 08/18/2020 9:29:46 am T: 08/18/2020 12:01:00 pm  JOB: [id number]/ [id number]

## 2020-08-18 NOTE — Anesthesia Procedure Notes (Signed)
Spinal  Patient location during procedure: OR Start time: 08/18/2020 7:42 AM End time: 08/18/2020 7:46 AM Staffing Performed: resident/CRNA  Resident/CRNA: Niel Hummer, CRNA Preanesthetic Checklist Completed: patient identified, IV checked, risks and benefits discussed, surgical consent, monitors and equipment checked and pre-op evaluation Spinal Block Patient position: sitting Prep: DuraPrep Patient monitoring: heart rate, continuous pulse ox and blood pressure Approach: midline Location: L3-4 Injection technique: single-shot Needle Needle type: Pencan  Needle gauge: 24 G Needle length: 10 cm Additional Notes Pt tolerated procedure well. No complications noted.

## 2020-08-18 NOTE — Interval H&P Note (Signed)
History and Physical Interval Note:  08/18/2020 7:34 AM  [name]  has presented today for surgery, with the diagnosis of djd left knee.  The various methods of treatment have been discussed with the patient and family. After consideration of risks, benefits and other options for treatment, the patient has consented to  Procedure(s): TOTAL KNEE ARTHROPLASTY (Left) as a surgical intervention.  The patient's history has been reviewed, patient examined, no change in status, stable for surgery.  I have reviewed the patient's chart and labs.  Questions were answered to the patient's satisfaction.     Yvette Rack

## 2020-08-18 NOTE — Discharge Instructions (Signed)
INSTRUCTIONS AFTER JOINT REPLACEMENT   o Remove items at home which could result in a fall. This includes throw rugs or furniture in walking pathways o ICE to the affected joint every three hours while awake for 30 minutes at a time, for at least the first 3-5 days, and then as needed for pain and swelling.  Continue to use ice for pain and swelling. You may notice swelling that will progress down to the foot and ankle.  This is normal after surgery.  Elevate your leg when you are not up walking on it.   o Continue to use the breathing machine you got in the hospital (incentive spirometer) which will help keep your temperature down.  It is common for your temperature to cycle up and down following surgery, especially at night when you are not up moving around and exerting yourself.  The breathing machine keeps your lungs expanded and your temperature down.   DIET:  As you were doing prior to hospitalization, we recommend a well-balanced diet.  DRESSING / WOUND CARE / SHOWERING  Keep the surgical dressing until follow up.  The dressing is water proof, so you can shower without any extra covering.  IF THE DRESSING FALLS OFF or the wound gets wet inside, change the dressing with sterile gauze.  Please use good hand washing techniques before changing the dressing.  Do not use any lotions or creams on the incision until instructed by your surgeon.    ACTIVITY  o Increase activity slowly as tolerated, but follow the weight bearing instructions below.   o No driving for 6 weeks or until further direction given by your physician.  You cannot drive while taking narcotics.  o No lifting or carrying greater than 10 lbs. until further directed by your surgeon. o Avoid periods of inactivity such as sitting longer than an hour when not asleep. This helps prevent blood clots.  o You may return to work once you are authorized by your doctor.     WEIGHT BEARING   Weight bearing as tolerated with assist  device (walker, cane, etc) as directed, use it as long as suggested by your surgeon or therapist, typically at least 4-6 weeks.   EXERCISES  Results after joint replacement surgery are often greatly improved when you follow the exercise, range of motion and muscle strengthening exercises prescribed by your doctor. Safety measures are also important to protect the joint from further injury. Any time any of these exercises cause you to have increased pain or swelling, decrease what you are doing until you are comfortable again and then slowly increase them. If you have problems or questions, call your caregiver or physical therapist for advice.   Rehabilitation is important following a joint replacement. After just a few days of immobilization, the muscles of the leg can become weakened and shrink (atrophy).  These exercises are designed to build up the tone and strength of the thigh and leg muscles and to improve motion. Often times heat used for twenty to thirty minutes before working out will loosen up your tissues and help with improving the range of motion but do not use heat for the first two weeks following surgery (sometimes heat can increase post-operative swelling).   These exercises can be done on a training (exercise) mat, on the floor, on a table or on a bed. Use whatever works the best and is most comfortable for you.    Use music or television while you are exercising so that   the exercises are a pleasant break in your day. This will make your life better with the exercises acting as a break in your routine that you can look forward to.   Perform all exercises about fifteen times, three times per day or as directed.  You should exercise both the operative leg and the other leg as well.  Exercises include:   . Quad Sets - Tighten up the muscle on the front of the thigh (Quad) and hold for 5-10 seconds.   . Straight Leg Raises - With your knee straight (if you were given a brace, keep it on),  lift the leg to 60 degrees, hold for 3 seconds, and slowly lower the leg.  Perform this exercise against resistance later as your leg gets stronger.  . Leg Slides: Lying on your back, slowly slide your foot toward your buttocks, bending your knee up off the floor (only go as far as is comfortable). Then slowly slide your foot back down until your leg is flat on the floor again.  . Angel Wings: Lying on your back spread your legs to the side as far apart as you can without causing discomfort.  . Hamstring Strength:  Lying on your back, push your heel against the floor with your leg straight by tightening up the muscles of your buttocks.  Repeat, but this time bend your knee to a comfortable angle, and push your heel against the floor.  You may put a pillow under the heel to make it more comfortable if necessary.   A rehabilitation program following joint replacement surgery can speed recovery and prevent re-injury in the future due to weakened muscles. Contact your doctor or a physical therapist for more information on knee rehabilitation.    CONSTIPATION  Constipation is defined medically as fewer than three stools per week and severe constipation as less than one stool per week.  Even if you have a regular bowel pattern at home, your normal regimen is likely to be disrupted due to multiple reasons following surgery.  Combination of anesthesia, postoperative narcotics, change in appetite and fluid intake all can affect your bowels.   YOU MUST use at least one of the following options; they are listed in order of increasing strength to get the job done.  They are all available over the counter, and you may need to use some, POSSIBLY even all of these options:    Drink plenty of fluids (prune juice may be helpful) and high fiber foods Colace 100 mg by mouth twice a day  Senokot for constipation as directed and as needed Dulcolax (bisacodyl), take with full glass of water  Miralax (polyethylene glycol)  once or twice a day as needed.  If you have tried all these things and are unable to have a bowel movement in the first 3-4 days after surgery call either your surgeon or your primary doctor.    If you experience loose stools or diarrhea, hold the medications until you stool forms back up.  If your symptoms do not get better within 1 week or if they get worse, check with your doctor.  If you experience "the worst abdominal pain ever" or develop nausea or vomiting, please contact the office immediately for further recommendations for treatment.   ITCHING:  If you experience itching with your medications, try taking only a single pain pill, or even half a pain pill at a time.  You can also use Benadryl over the counter for itching or also to   help with sleep.   TED HOSE STOCKINGS:  Use stockings on both legs until for at least 2 weeks or as directed by physician office. They may be removed at night for sleeping.  MEDICATIONS:  See your medication summary on the "After Visit Summary" that nursing will review with you.  You may have some home medications which will be placed on hold until you complete the course of blood thinner medication.  It is important for you to complete the blood thinner medication as prescribed.  PRECAUTIONS:  If you experience chest pain or shortness of breath - call 911 immediately for transfer to the hospital emergency department.   If you develop a fever greater that 101 F, purulent drainage from wound, increased redness or drainage from wound, foul odor from the wound/dressing, or calf pain - CONTACT YOUR SURGEON.                                                   FOLLOW-UP APPOINTMENTS:  If you do not already have a post-op appointment, please call the office for an appointment to be seen by your surgeon.  Guidelines for how soon to be seen are listed in your "After Visit Summary", but are typically between 1-4 weeks after surgery.  OTHER INSTRUCTIONS:   Knee  Replacement:  Do not place pillow under knee, focus on keeping the knee straight while resting. CPM instructions: 0-90 degrees, 2 hours in the morning, 2 hours in the afternoon, and 2 hours in the evening. Place foam block, curve side up under heel at all times except when in CPM or when walking.  DO NOT modify, tear, cut, or change the foam block in any way.   DENTAL ANTIBIOTICS:  In most cases prophylactic antibiotics for Dental procdeures after total joint surgery are not necessary.  Exceptions are as follows:  1. History of prior total joint infection  2. Severely immunocompromised (Organ Transplant, cancer chemotherapy, Rheumatoid biologic meds such as [place])  3. Poorly controlled diabetes (A1C &gt; 8.0, blood glucose over 200)  If you have one of these conditions, contact your surgeon for an antibiotic prescription, prior to your dental procedure.   MAKE SURE YOU:  . Understand these instructions.  . Get help right away if you are not doing well or get worse.    Thank you for letting us be a part of your medical care team.  It is a privilege we respect greatly.  We hope these instructions will help you stay on track for a fast and full recovery!    General Anesthesia, Adult, Care After This sheet gives you information about how to care for yourself after your procedure. Your health care provider may also give you more specific instructions. If you have problems or questions, contact your health care provider. What can I expect after the procedure? After the procedure, the following side effects are common:  Pain or discomfort at the IV site.  Nausea.  Vomiting.  Sore throat.  Trouble concentrating.  Feeling cold or chills.  Feeling weak or tired.  Sleepiness and fatigue.  Soreness and body aches. These side effects can affect parts of the body that were not involved in surgery. Follow these instructions at home: For the time period you were told by your  health care provider:  Rest.  Do not participate in activities  where you could fall or become injured.  Do not drive or use machinery.  Do not drink alcohol.  Do not take sleeping pills or medicines that cause drowsiness.  Do not make important decisions or sign legal documents.  Do not take care of children on your own.   Eating and drinking  Follow any instructions from your health care provider about eating or drinking restrictions.  When you feel hungry, start by eating small amounts of foods that are soft and easy to digest (bland), such as toast. Gradually return to your regular diet.  Drink enough fluid to keep your urine pale yellow.  If you vomit, rehydrate by drinking water, juice, or clear broth. General instructions  If you have sleep apnea, surgery and certain medicines can increase your risk for breathing problems. Follow instructions from your health care provider about wearing your sleep device: ? Anytime you are sleeping, including during daytime naps. ? While taking prescription pain medicines, sleeping medicines, or medicines that make you drowsy.  Have a responsible adult stay with you for the time you are told. It is important to have someone help care for you until you are awake and alert.  Return to your normal activities as told by your health care provider. Ask your health care provider what activities are safe for you.  Take over-the-counter and prescription medicines only as told by your health care provider.  If you smoke, do not smoke without supervision.  Keep all follow-up visits as told by your health care provider. This is important. Contact a health care provider if:  You have nausea or vomiting that does not get better with medicine.  You cannot eat or drink without vomiting.  You have pain that does not get better with medicine.  You are unable to pass urine.  You develop a skin rash.  You have a fever.  You have redness around  your IV site that gets worse. Get help right away if:  You have difficulty breathing.  You have chest pain.  You have blood in your urine or stool, or you vomit blood. Summary  After the procedure, it is common to have a sore throat or nausea. It is also common to feel tired.  Have a responsible adult stay with you for the time you are told. It is important to have someone help care for you until you are awake and alert.  When you feel hungry, start by eating small amounts of foods that are soft and easy to digest (bland), such as toast. Gradually return to your regular diet.  Drink enough fluid to keep your urine pale yellow.  Return to your normal activities as told by your health care provider. Ask your health care provider what activities are safe for you. This information is not intended to replace advice given to you by your health care provider. Make sure you discuss any questions you have with your health care provider. Document Revised: 02/24/2020 Document Reviewed: 09/23/2019 Elsevier Patient Education  2021 [employer].

## 2020-08-18 NOTE — Anesthesia Postprocedure Evaluation (Signed)
Anesthesia Post Note  Patient: [name]  Procedure(s) Performed: TOTAL KNEE ARTHROPLASTY (Left Knee)     Patient location during evaluation: PACU Anesthesia Type: Spinal Level of consciousness: oriented and awake and alert Pain management: pain level controlled Vital Signs Assessment: post-procedure vital signs reviewed and stable Respiratory status: spontaneous breathing, respiratory function stable and patient connected to nasal cannula oxygen Cardiovascular status: blood pressure returned to baseline and stable Postop Assessment: no headache, no backache and no apparent nausea or vomiting Anesthetic complications: no   No complications documented.  Last Vitals:  Vitals:   08/18/20 1000 08/18/20 1015  BP: 105/74 (!) 118/94  Pulse: 74 69  Resp: 20 17  Temp: (!) 36.1 C   SpO2: 97% 99%    Last Pain:  Vitals:   08/18/20 1015  TempSrc:   PainSc: 4                  ROSE,GEORGE S

## 2020-08-18 NOTE — Evaluation (Signed)
Physical Therapy Evaluation Patient Details Name: [name] MRN: [id number] DOB: [birth year] Today's Date: 08/18/2020   History of Present Illness  Patient is an 80y.o. male s/p Lt TKA on 08/18/2020 with PMH significant for pre-diabetes, neuropathy, HTN, HLD, hearing loss, dyspnea, CAD, COPD, OA, prostate cancer, back surgery, knee surgery, neck surgery, and shoulder surgery.  Clinical Impression   Pt is an 80y.o. male s/p Lt TKA POD 0. Pt reports that he is independent with mobility at baseline. Pt required MIN guard and verbal cues for sit to stand transfers. Pt required MIN guard progressing to supervision for ambulation 45ft with verbal cues for RW management and step to gait pattern with no LOB or knee buckling. Pt was able to safely perform stair negotiation with MIN assist for RW management and  MIN guard for safety with cues for sequencing. PT reviewed therapeutic interventions for promotion of DVT prevention, pt demonstrated understanding. Pt's wife and son will be available to assist upon discharge. Pt is currently at a safe mobility level for discharge home. Recommend home with family support. Pt will benefit from skilled PT to increase independence and safety with mobility.      Follow Up Recommendations Home health PT;Follow surgeon's recommendation for DC plan and follow-up therapies    Equipment Recommendations  None recommended by PT (pt owns RW)    Recommendations for Other Services       Precautions / Restrictions Precautions Precautions: Fall Restrictions Weight Bearing Restrictions: No Other Position/Activity Restrictions: WBAT      Mobility  Bed Mobility Overal bed mobility: Needs Assistance Bed Mobility: Supine to Sit     Supine to sit: Supervision;HOB elevated     General bed mobility comments: pt with use  of B UEs to scot ot EOB with supervision for safety    Transfers Overall transfer level: Needs assistance Equipment used: Rolling walker (2  wheeled) Transfers: Sit to/from Stand Sit to Stand: Min guard         General transfer comment: PT reviewed WBAT status priro to transfer. MIN guard for safety with cues for safe had placement .  Ambulation/Gait Ambulation/Gait assistance: Min guard;Supervision Gait Distance (Feet): 90 Feet Assistive device: Rolling walker (2 wheeled) Gait Pattern/deviations: Step-to pattern;Decreased stride length;Decreased weight shift to left Gait velocity: decr   General Gait Details: Pt perfomred pre gait marching with MIN guard and use of B UEs on RW with no knee buckling. MIN guard progressing to supervision for safety with cues for step to gait pattern and RW management with no LOB.  Stairs Stairs: Yes Stairs assistance: Min guard;Min assist Stair Management: No rails;Step to pattern;Forwards;With walker Number of Stairs: 3 General stair comments: MIN assist for RW stability and MIN guard for safety with cues for sequencing "up with the good, down with the bad." Pt was able to verbalize correct sequencing and safe guarding position for family members at home.  Wheelchair Mobility    Modified Rankin (Stroke Patients Only)       Balance Overall balance assessment: Needs assistance Sitting-balance support: Feet supported Sitting balance-Leahy Scale: Good     Standing balance support: During functional activity;Bilateral upper extremity supported;No upper extremity supported Standing balance-Leahy Scale: Fair Standing balance comment: pt was able to stand at sink to perform hand hygiene without use of UEs on RW and supervision for safety.  Pertinent Vitals/Pain Pain Assessment: 0-10 Pain Score: 2  Pain Location: Lt knee Pain Descriptors / Indicators: Aching;Discomfort Pain Intervention(s): Limited activity within patient's tolerance;Monitored during session;Repositioned    Home Living Family/patient expects to be discharged to:: Private  residence Living Arrangements: Spouse/significant other Available Help at Discharge: Family;Neighbor Type of Home: House Home Access: Stairs to enter Entrance Stairs-Rails: None Entrance Stairs-Number of Steps: 2 Home Layout: Two level Home Equipment: Environmental consultant - 2 wheels;Bedside commode Additional Comments: pt will be staying on main level. Pt will have assist from his wife at home and his son is coming into town for a few days to help .    Prior Function Level of Independence: Independent               Hand Dominance   Dominant Hand: Right    Extremity/Trunk Assessment   Upper Extremity Assessment Upper Extremity Assessment: Overall WFL for tasks assessed    Lower Extremity Assessment Lower Extremity Assessment: LLE deficits/detail LLE Deficits / Details: pt with good quad set strength and 4+/5 B dorsi/plantar flexion strength. Pt able to complete full SLR with no extensor lag. LLE Sensation: WNL LLE Coordination: WNL    Cervical / Trunk Assessment Cervical / Trunk Assessment: Normal  Communication   Communication: No difficulties  Cognition Arousal/Alertness: Awake/alert Behavior During Therapy: WFL for tasks assessed/performed Overall Cognitive Status: Within Functional Limits for tasks assessed                                        General Comments      Exercises Total Joint Exercises Ankle Circles/Pumps: AROM;Both;20 reps;Seated Quad Sets: AROM;Left;5 reps;Seated Short Arc Quad: AROM;Left;5 reps;Seated Heel Slides: AROM;Left;5 reps;Seated Hip ABduction/ADduction: AROM;Left;5 reps;Seated Straight Leg Raises: AROM;Left;5 reps;Seated   Assessment/Plan    PT Assessment Patient needs continued PT services  PT Problem List Decreased strength;Decreased range of motion;Decreased mobility;Decreased balance;Decreased activity tolerance;Decreased knowledge of use of DME;Pain       PT Treatment Interventions DME instruction;Gait training;Stair  training;Functional mobility training;Therapeutic activities;Therapeutic exercise;Balance training;Patient/family education    PT Goals (Current goals can be found in the Care Plan section)  Acute Rehab PT Goals Patient Stated Goal: get back to light yard work and he likes to do wood working PT Goal Formulation: With patient Time For Goal Achievement: 08/25/20 Potential to Achieve Goals: Good    Frequency 7X/week   Barriers to discharge        Co-evaluation               AM-PAC PT "6 Clicks" Mobility  Outcome Measure Help needed turning from your back to your side while in a flat bed without using bedrails?: None Help needed moving from lying on your back to sitting on the side of a flat bed without using bedrails?: None Help needed moving to and from a bed to a chair (including a wheelchair)?: A Little Help needed standing up from a chair using your arms (e.g., wheelchair or bedside chair)?: A Little Help needed to walk in hospital room?: A Little Help needed climbing 3-5 steps with a railing? : A Little 6 Click Score: 20    End of Session Equipment Utilized During Treatment: Gait belt Activity Tolerance: Patient tolerated treatment well Patient left: in chair;with call bell/phone within reach Nurse Communication: Mobility status PT Visit Diagnosis: Unsteadiness on feet (R26.81);Muscle weakness (generalized) (M62.81);Pain Pain - Right/Left: Left Pain -  part of body: Knee    Time: 8457-3344 PT Time Calculation (min) (ACUTE ONLY): 33 min   Charges:              Elna Breslow, SPT  Acute rehab   Elna Breslow 08/18/2020, 2:09 PM

## 2020-08-18 NOTE — Anesthesia Procedure Notes (Signed)
Procedure Name: MAC Date/Time: 08/18/2020 7:42 AM Performed by: Niel Hummer, CRNA Pre-anesthesia Checklist: Patient identified, Emergency Drugs available, Suction available and Patient being monitored Oxygen Delivery Method: Simple face mask

## 2020-08-20 DIAGNOSIS — M19041 Primary osteoarthritis, right hand: Secondary | ICD-10-CM | POA: Diagnosis not present

## 2020-08-20 DIAGNOSIS — I2510 Atherosclerotic heart disease of native coronary artery without angina pectoris: Secondary | ICD-10-CM | POA: Diagnosis not present

## 2020-08-20 DIAGNOSIS — Z471 Aftercare following joint replacement surgery: Secondary | ICD-10-CM | POA: Diagnosis not present

## 2020-08-20 DIAGNOSIS — M19042 Primary osteoarthritis, left hand: Secondary | ICD-10-CM | POA: Diagnosis not present

## 2020-08-20 DIAGNOSIS — J329 Chronic sinusitis, unspecified: Secondary | ICD-10-CM | POA: Diagnosis not present

## 2020-08-20 DIAGNOSIS — J479 Bronchiectasis, uncomplicated: Secondary | ICD-10-CM | POA: Diagnosis not present

## 2020-08-20 DIAGNOSIS — E1140 Type 2 diabetes mellitus with diabetic neuropathy, unspecified: Secondary | ICD-10-CM | POA: Diagnosis not present

## 2020-08-20 DIAGNOSIS — I10 Essential (primary) hypertension: Secondary | ICD-10-CM | POA: Diagnosis not present

## 2020-08-20 DIAGNOSIS — J309 Allergic rhinitis, unspecified: Secondary | ICD-10-CM | POA: Diagnosis not present

## 2020-08-21 ENCOUNTER — Encounter (HOSPITAL_COMMUNITY): Payer: Self-pay | Admitting: Orthopedic Surgery

## 2020-08-22 DIAGNOSIS — M19042 Primary osteoarthritis, left hand: Secondary | ICD-10-CM | POA: Diagnosis not present

## 2020-08-22 DIAGNOSIS — E1140 Type 2 diabetes mellitus with diabetic neuropathy, unspecified: Secondary | ICD-10-CM | POA: Diagnosis not present

## 2020-08-22 DIAGNOSIS — J309 Allergic rhinitis, unspecified: Secondary | ICD-10-CM | POA: Diagnosis not present

## 2020-08-22 DIAGNOSIS — J329 Chronic sinusitis, unspecified: Secondary | ICD-10-CM | POA: Diagnosis not present

## 2020-08-22 DIAGNOSIS — I2510 Atherosclerotic heart disease of native coronary artery without angina pectoris: Secondary | ICD-10-CM | POA: Diagnosis not present

## 2020-08-22 DIAGNOSIS — Z471 Aftercare following joint replacement surgery: Secondary | ICD-10-CM | POA: Diagnosis not present

## 2020-08-22 DIAGNOSIS — J479 Bronchiectasis, uncomplicated: Secondary | ICD-10-CM | POA: Diagnosis not present

## 2020-08-22 DIAGNOSIS — M19041 Primary osteoarthritis, right hand: Secondary | ICD-10-CM | POA: Diagnosis not present

## 2020-08-22 DIAGNOSIS — I10 Essential (primary) hypertension: Secondary | ICD-10-CM | POA: Diagnosis not present

## 2020-08-23 DIAGNOSIS — E1140 Type 2 diabetes mellitus with diabetic neuropathy, unspecified: Secondary | ICD-10-CM | POA: Diagnosis not present

## 2020-08-23 DIAGNOSIS — M19041 Primary osteoarthritis, right hand: Secondary | ICD-10-CM | POA: Diagnosis not present

## 2020-08-23 DIAGNOSIS — I10 Essential (primary) hypertension: Secondary | ICD-10-CM | POA: Diagnosis not present

## 2020-08-23 DIAGNOSIS — I2510 Atherosclerotic heart disease of native coronary artery without angina pectoris: Secondary | ICD-10-CM | POA: Diagnosis not present

## 2020-08-23 DIAGNOSIS — Z471 Aftercare following joint replacement surgery: Secondary | ICD-10-CM | POA: Diagnosis not present

## 2020-08-23 DIAGNOSIS — M19042 Primary osteoarthritis, left hand: Secondary | ICD-10-CM | POA: Diagnosis not present

## 2020-08-23 DIAGNOSIS — J479 Bronchiectasis, uncomplicated: Secondary | ICD-10-CM | POA: Diagnosis not present

## 2020-08-23 DIAGNOSIS — J309 Allergic rhinitis, unspecified: Secondary | ICD-10-CM | POA: Diagnosis not present

## 2020-08-23 DIAGNOSIS — J329 Chronic sinusitis, unspecified: Secondary | ICD-10-CM | POA: Diagnosis not present

## 2020-08-25 DIAGNOSIS — I2510 Atherosclerotic heart disease of native coronary artery without angina pectoris: Secondary | ICD-10-CM | POA: Diagnosis not present

## 2020-08-25 DIAGNOSIS — I10 Essential (primary) hypertension: Secondary | ICD-10-CM | POA: Diagnosis not present

## 2020-08-25 DIAGNOSIS — M19042 Primary osteoarthritis, left hand: Secondary | ICD-10-CM | POA: Diagnosis not present

## 2020-08-25 DIAGNOSIS — E1140 Type 2 diabetes mellitus with diabetic neuropathy, unspecified: Secondary | ICD-10-CM | POA: Diagnosis not present

## 2020-08-25 DIAGNOSIS — J479 Bronchiectasis, uncomplicated: Secondary | ICD-10-CM | POA: Diagnosis not present

## 2020-08-25 DIAGNOSIS — J329 Chronic sinusitis, unspecified: Secondary | ICD-10-CM | POA: Diagnosis not present

## 2020-08-25 DIAGNOSIS — J309 Allergic rhinitis, unspecified: Secondary | ICD-10-CM | POA: Diagnosis not present

## 2020-08-25 DIAGNOSIS — M19041 Primary osteoarthritis, right hand: Secondary | ICD-10-CM | POA: Diagnosis not present

## 2020-08-25 DIAGNOSIS — Z471 Aftercare following joint replacement surgery: Secondary | ICD-10-CM | POA: Diagnosis not present

## 2020-08-27 DIAGNOSIS — J479 Bronchiectasis, uncomplicated: Secondary | ICD-10-CM | POA: Diagnosis not present

## 2020-08-27 DIAGNOSIS — M19042 Primary osteoarthritis, left hand: Secondary | ICD-10-CM | POA: Diagnosis not present

## 2020-08-27 DIAGNOSIS — J309 Allergic rhinitis, unspecified: Secondary | ICD-10-CM | POA: Diagnosis not present

## 2020-08-27 DIAGNOSIS — E1140 Type 2 diabetes mellitus with diabetic neuropathy, unspecified: Secondary | ICD-10-CM | POA: Diagnosis not present

## 2020-08-27 DIAGNOSIS — M19041 Primary osteoarthritis, right hand: Secondary | ICD-10-CM | POA: Diagnosis not present

## 2020-08-27 DIAGNOSIS — J329 Chronic sinusitis, unspecified: Secondary | ICD-10-CM | POA: Diagnosis not present

## 2020-08-27 DIAGNOSIS — I2510 Atherosclerotic heart disease of native coronary artery without angina pectoris: Secondary | ICD-10-CM | POA: Diagnosis not present

## 2020-08-27 DIAGNOSIS — I10 Essential (primary) hypertension: Secondary | ICD-10-CM | POA: Diagnosis not present

## 2020-08-27 DIAGNOSIS — Z471 Aftercare following joint replacement surgery: Secondary | ICD-10-CM | POA: Diagnosis not present

## 2020-08-28 DIAGNOSIS — J479 Bronchiectasis, uncomplicated: Principal | ICD-10-CM

## 2021-01-02 DIAGNOSIS — J479 Bronchiectasis, uncomplicated: Principal | ICD-10-CM

## 2021-01-02 MED ORDER — ALBUTEROL SULFATE 2.5 MG/3 ML (0.083 %) SOLUTION FOR NEBULIZATION
Freq: Two times a day (BID) | RESPIRATORY_TRACT | 90.00000 days
Start: 2021-01-02 — End: 2022-01-02

## 2021-01-02 MED ORDER — SODIUM CHLORIDE 3.5 % FOR NEBULIZATION
Freq: Two times a day (BID) | RESPIRATORY_TRACT | 90.00000 days
Start: 2021-01-02 — End: 2022-01-02

## 2021-01-03 MED ORDER — SPIRIVA WITH HANDIHALER 18 MCG AND INHALATION CAPSULES
ORAL_CAPSULE | Freq: Every day | RESPIRATORY_TRACT | 3 refills | 90.00000 days | Status: CP
Start: 2021-01-03 — End: 2022-01-03

## 2021-01-03 MED ORDER — ALBUTEROL SULFATE 2.5 MG/3 ML (0.083 %) SOLUTION FOR NEBULIZATION
Freq: Two times a day (BID) | RESPIRATORY_TRACT | 3 refills | 90 days | Status: CP
Start: 2021-01-03 — End: 2022-01-03

## 2021-01-03 MED ORDER — SODIUM CHLORIDE 3.5 % FOR NEBULIZATION
Freq: Two times a day (BID) | RESPIRATORY_TRACT | 3 refills | 90 days | Status: CP
Start: 2021-01-03 — End: 2022-01-03

## 2021-01-04 DIAGNOSIS — M720 Palmar fascial fibromatosis [Dupuytren]: Secondary | ICD-10-CM | POA: Diagnosis not present

## 2021-01-08 DIAGNOSIS — M1712 Unilateral primary osteoarthritis, left knee: Secondary | ICD-10-CM | POA: Diagnosis not present

## 2021-01-10 ENCOUNTER — Other Ambulatory Visit: Payer: Self-pay

## 2021-01-10 DIAGNOSIS — M720 Palmar fascial fibromatosis [Dupuytren]: Secondary | ICD-10-CM | POA: Diagnosis not present

## 2021-01-16 DIAGNOSIS — M25641 Stiffness of right hand, not elsewhere classified: Secondary | ICD-10-CM | POA: Diagnosis not present

## 2021-01-24 DIAGNOSIS — M25641 Stiffness of right hand, not elsewhere classified: Secondary | ICD-10-CM | POA: Diagnosis not present

## 2021-02-01 DIAGNOSIS — M25641 Stiffness of right hand, not elsewhere classified: Secondary | ICD-10-CM | POA: Diagnosis not present

## 2021-02-13 DIAGNOSIS — M25641 Stiffness of right hand, not elsewhere classified: Secondary | ICD-10-CM | POA: Diagnosis not present

## 2021-02-14 DIAGNOSIS — Z8546 Personal history of malignant neoplasm of prostate: Secondary | ICD-10-CM | POA: Diagnosis not present

## 2021-02-14 DIAGNOSIS — Z0000 Encounter for general adult medical examination without abnormal findings: Secondary | ICD-10-CM | POA: Diagnosis not present

## 2021-02-14 DIAGNOSIS — I10 Essential (primary) hypertension: Secondary | ICD-10-CM | POA: Diagnosis not present

## 2021-02-14 DIAGNOSIS — E118 Type 2 diabetes mellitus with unspecified complications: Secondary | ICD-10-CM | POA: Diagnosis not present

## 2021-02-14 DIAGNOSIS — Z125 Encounter for screening for malignant neoplasm of prostate: Secondary | ICD-10-CM | POA: Diagnosis not present

## 2021-02-14 DIAGNOSIS — Z1389 Encounter for screening for other disorder: Secondary | ICD-10-CM | POA: Diagnosis not present

## 2021-02-16 DIAGNOSIS — I10 Essential (primary) hypertension: Secondary | ICD-10-CM | POA: Diagnosis not present

## 2021-02-16 DIAGNOSIS — J479 Bronchiectasis, uncomplicated: Secondary | ICD-10-CM | POA: Diagnosis not present

## 2021-02-16 DIAGNOSIS — Z0000 Encounter for general adult medical examination without abnormal findings: Secondary | ICD-10-CM | POA: Diagnosis not present

## 2021-02-16 DIAGNOSIS — E1169 Type 2 diabetes mellitus with other specified complication: Secondary | ICD-10-CM | POA: Diagnosis not present

## 2021-02-16 DIAGNOSIS — E782 Mixed hyperlipidemia: Secondary | ICD-10-CM | POA: Diagnosis not present

## 2021-02-16 DIAGNOSIS — G4700 Insomnia, unspecified: Secondary | ICD-10-CM | POA: Diagnosis not present

## 2021-02-16 DIAGNOSIS — J449 Chronic obstructive pulmonary disease, unspecified: Secondary | ICD-10-CM | POA: Diagnosis not present

## 2021-02-20 DIAGNOSIS — M25641 Stiffness of right hand, not elsewhere classified: Secondary | ICD-10-CM | POA: Diagnosis not present

## 2021-07-03 DIAGNOSIS — H43811 Vitreous degeneration, right eye: Secondary | ICD-10-CM | POA: Diagnosis not present

## 2021-07-03 DIAGNOSIS — E119 Type 2 diabetes mellitus without complications: Secondary | ICD-10-CM | POA: Diagnosis not present

## 2021-07-03 DIAGNOSIS — Z961 Presence of intraocular lens: Secondary | ICD-10-CM | POA: Diagnosis not present

## 2021-07-03 DIAGNOSIS — H04123 Dry eye syndrome of bilateral lacrimal glands: Secondary | ICD-10-CM | POA: Diagnosis not present

## 2021-07-04 DIAGNOSIS — J479 Bronchiectasis, uncomplicated: Principal | ICD-10-CM

## 2021-07-11 DIAGNOSIS — D485 Neoplasm of uncertain behavior of skin: Secondary | ICD-10-CM | POA: Diagnosis not present

## 2021-07-11 DIAGNOSIS — L570 Actinic keratosis: Secondary | ICD-10-CM | POA: Diagnosis not present

## 2021-07-11 DIAGNOSIS — Z85828 Personal history of other malignant neoplasm of skin: Secondary | ICD-10-CM | POA: Diagnosis not present

## 2021-07-11 DIAGNOSIS — D0472 Carcinoma in situ of skin of left lower limb, including hip: Secondary | ICD-10-CM | POA: Diagnosis not present

## 2021-07-11 DIAGNOSIS — L814 Other melanin hyperpigmentation: Secondary | ICD-10-CM | POA: Diagnosis not present

## 2021-07-11 DIAGNOSIS — D225 Melanocytic nevi of trunk: Secondary | ICD-10-CM | POA: Diagnosis not present

## 2021-07-11 DIAGNOSIS — C44311 Basal cell carcinoma of skin of nose: Secondary | ICD-10-CM | POA: Diagnosis not present

## 2021-07-11 DIAGNOSIS — L821 Other seborrheic keratosis: Secondary | ICD-10-CM | POA: Diagnosis not present

## 2021-07-13 ENCOUNTER — Ambulatory Visit: Admit: 2021-07-13 | Discharge: 2021-07-13 | Payer: MEDICARE

## 2021-07-13 ENCOUNTER — Ambulatory Visit: Admit: 2021-07-13 | Discharge: 2021-07-13 | Payer: MEDICARE | Attending: Internal Medicine | Primary: Internal Medicine

## 2021-07-13 DIAGNOSIS — J988 Other specified respiratory disorders: Principal | ICD-10-CM

## 2021-07-13 DIAGNOSIS — J479 Bronchiectasis, uncomplicated: Principal | ICD-10-CM

## 2021-07-13 DIAGNOSIS — B965 Pseudomonas (aeruginosa) (mallei) (pseudomallei) as the cause of diseases classified elsewhere: Principal | ICD-10-CM

## 2021-07-13 DIAGNOSIS — I10 Essential (primary) hypertension: Secondary | ICD-10-CM | POA: Diagnosis not present

## 2021-07-13 DIAGNOSIS — Z888 Allergy status to other drugs, medicaments and biological substances status: Secondary | ICD-10-CM | POA: Diagnosis not present

## 2021-07-13 DIAGNOSIS — J329 Chronic sinusitis, unspecified: Secondary | ICD-10-CM | POA: Diagnosis not present

## 2021-07-13 DIAGNOSIS — Z7951 Long term (current) use of inhaled steroids: Secondary | ICD-10-CM | POA: Diagnosis not present

## 2021-07-13 DIAGNOSIS — Z87891 Personal history of nicotine dependence: Secondary | ICD-10-CM | POA: Diagnosis not present

## 2021-07-13 DIAGNOSIS — Z7982 Long term (current) use of aspirin: Secondary | ICD-10-CM | POA: Diagnosis not present

## 2021-07-13 DIAGNOSIS — Z885 Allergy status to narcotic agent status: Secondary | ICD-10-CM | POA: Diagnosis not present

## 2021-07-13 DIAGNOSIS — E7800 Pure hypercholesterolemia, unspecified: Secondary | ICD-10-CM | POA: Diagnosis not present

## 2021-07-13 MED ORDER — ALBUTEROL SULFATE HFA 90 MCG/ACTUATION AEROSOL INHALER
RESPIRATORY_TRACT | 11 refills | 50 days | Status: CP | PRN
Start: 2021-07-13 — End: ?

## 2021-07-18 DIAGNOSIS — J479 Bronchiectasis, uncomplicated: Principal | ICD-10-CM

## 2021-07-19 DIAGNOSIS — J479 Bronchiectasis, uncomplicated: Secondary | ICD-10-CM | POA: Diagnosis not present

## 2021-08-09 DIAGNOSIS — C44729 Squamous cell carcinoma of skin of left lower limb, including hip: Secondary | ICD-10-CM | POA: Diagnosis not present

## 2021-08-09 DIAGNOSIS — C44311 Basal cell carcinoma of skin of nose: Secondary | ICD-10-CM | POA: Diagnosis not present

## 2021-08-22 DIAGNOSIS — E118 Type 2 diabetes mellitus with unspecified complications: Secondary | ICD-10-CM | POA: Diagnosis not present

## 2021-08-22 DIAGNOSIS — J479 Bronchiectasis, uncomplicated: Secondary | ICD-10-CM | POA: Diagnosis not present

## 2021-08-22 DIAGNOSIS — I10 Essential (primary) hypertension: Secondary | ICD-10-CM | POA: Diagnosis not present

## 2021-08-22 DIAGNOSIS — E1169 Type 2 diabetes mellitus with other specified complication: Secondary | ICD-10-CM | POA: Diagnosis not present

## 2021-08-23 DIAGNOSIS — I10 Essential (primary) hypertension: Secondary | ICD-10-CM | POA: Diagnosis not present

## 2021-08-23 DIAGNOSIS — Z0289 Encounter for other administrative examinations: Secondary | ICD-10-CM | POA: Diagnosis not present

## 2021-08-23 DIAGNOSIS — Z7689 Persons encountering health services in other specified circumstances: Secondary | ICD-10-CM | POA: Diagnosis not present

## 2021-08-23 DIAGNOSIS — C61 Malignant neoplasm of prostate: Secondary | ICD-10-CM | POA: Diagnosis not present

## 2021-08-23 DIAGNOSIS — E785 Hyperlipidemia, unspecified: Secondary | ICD-10-CM | POA: Diagnosis not present

## 2021-08-23 DIAGNOSIS — J449 Chronic obstructive pulmonary disease, unspecified: Secondary | ICD-10-CM | POA: Diagnosis not present

## 2021-09-24 DIAGNOSIS — C44311 Basal cell carcinoma of skin of nose: Secondary | ICD-10-CM | POA: Diagnosis not present

## 2021-10-04 DIAGNOSIS — L905 Scar conditions and fibrosis of skin: Secondary | ICD-10-CM | POA: Diagnosis not present

## 2021-10-04 DIAGNOSIS — Z48817 Encounter for surgical aftercare following surgery on the skin and subcutaneous tissue: Secondary | ICD-10-CM | POA: Diagnosis not present

## 2021-10-05 DIAGNOSIS — R7401 Elevation of levels of liver transaminase levels: Secondary | ICD-10-CM | POA: Diagnosis not present

## 2021-11-16 DIAGNOSIS — Z48817 Encounter for surgical aftercare following surgery on the skin and subcutaneous tissue: Secondary | ICD-10-CM | POA: Diagnosis not present

## 2021-11-16 DIAGNOSIS — E118 Type 2 diabetes mellitus with unspecified complications: Secondary | ICD-10-CM | POA: Diagnosis not present

## 2021-11-16 DIAGNOSIS — L905 Scar conditions and fibrosis of skin: Secondary | ICD-10-CM | POA: Diagnosis not present

## 2021-12-28 ENCOUNTER — Ambulatory Visit: Admit: 2021-12-28 | Discharge: 2021-12-29 | Payer: MEDICARE | Attending: Internal Medicine | Primary: Internal Medicine

## 2021-12-28 DIAGNOSIS — J479 Bronchiectasis, uncomplicated: Principal | ICD-10-CM

## 2021-12-28 DIAGNOSIS — J329 Chronic sinusitis, unspecified: Principal | ICD-10-CM

## 2021-12-28 MED ORDER — ALBUTEROL SULFATE 2.5 MG/3 ML (0.083 %) SOLUTION FOR NEBULIZATION
Freq: Two times a day (BID) | RESPIRATORY_TRACT | 3 refills | 90 days | Status: CP
Start: 2021-12-28 — End: 2022-12-28
  Filled 2022-01-08: qty 540, 90d supply, fill #0

## 2021-12-28 MED ORDER — NEBULIZER ACCESSORIES KIT
PACK | 3 refills | 0 days | Status: CP
Start: 2021-12-28 — End: ?

## 2021-12-28 MED ORDER — SODIUM CHLORIDE 3 % FOR NEBULIZATION
Freq: Two times a day (BID) | RESPIRATORY_TRACT | 3 refills | 90 days | Status: CP
Start: 2021-12-28 — End: 2022-12-28

## 2022-01-08 MED FILL — LC PLUS MISC: 30 days supply | Qty: 3 | Fill #0

## 2022-01-24 ENCOUNTER — Ambulatory Visit
Admit: 2022-01-24 | Discharge: 2022-01-25 | Payer: MEDICARE | Attending: Student in an Organized Health Care Education/Training Program | Primary: Student in an Organized Health Care Education/Training Program

## 2022-01-24 DIAGNOSIS — J329 Chronic sinusitis, unspecified: Principal | ICD-10-CM

## 2022-01-24 MED ORDER — AMOXICILLIN 875 MG-POTASSIUM CLAVULANATE 125 MG TABLET
ORAL_TABLET | Freq: Two times a day (BID) | ORAL | 0 refills | 10 days | Status: CP
Start: 2022-01-24 — End: 2022-02-03

## 2022-01-31 DIAGNOSIS — Z0000 Encounter for general adult medical examination without abnormal findings: Secondary | ICD-10-CM | POA: Diagnosis not present

## 2022-02-11 DIAGNOSIS — R7301 Impaired fasting glucose: Secondary | ICD-10-CM | POA: Diagnosis not present

## 2022-02-11 DIAGNOSIS — J449 Chronic obstructive pulmonary disease, unspecified: Secondary | ICD-10-CM | POA: Diagnosis not present

## 2022-02-11 DIAGNOSIS — R0989 Other specified symptoms and signs involving the circulatory and respiratory systems: Secondary | ICD-10-CM | POA: Diagnosis not present

## 2022-02-11 DIAGNOSIS — L551 Sunburn of second degree: Secondary | ICD-10-CM | POA: Diagnosis not present

## 2022-02-11 DIAGNOSIS — I10 Essential (primary) hypertension: Secondary | ICD-10-CM | POA: Diagnosis not present

## 2022-02-11 DIAGNOSIS — E785 Hyperlipidemia, unspecified: Secondary | ICD-10-CM | POA: Diagnosis not present

## 2022-02-11 DIAGNOSIS — C61 Malignant neoplasm of prostate: Secondary | ICD-10-CM | POA: Diagnosis not present

## 2022-02-11 DIAGNOSIS — Z0000 Encounter for general adult medical examination without abnormal findings: Secondary | ICD-10-CM | POA: Diagnosis not present

## 2022-02-15 DIAGNOSIS — R0989 Other specified symptoms and signs involving the circulatory and respiratory systems: Secondary | ICD-10-CM | POA: Diagnosis not present

## 2022-03-26 DIAGNOSIS — L821 Other seborrheic keratosis: Secondary | ICD-10-CM | POA: Diagnosis not present

## 2022-03-26 DIAGNOSIS — L814 Other melanin hyperpigmentation: Secondary | ICD-10-CM | POA: Diagnosis not present

## 2022-03-26 DIAGNOSIS — D225 Melanocytic nevi of trunk: Secondary | ICD-10-CM | POA: Diagnosis not present

## 2022-03-26 DIAGNOSIS — Z08 Encounter for follow-up examination after completed treatment for malignant neoplasm: Secondary | ICD-10-CM | POA: Diagnosis not present

## 2022-03-26 DIAGNOSIS — Z85828 Personal history of other malignant neoplasm of skin: Secondary | ICD-10-CM | POA: Diagnosis not present

## 2022-03-26 DIAGNOSIS — L218 Other seborrheic dermatitis: Secondary | ICD-10-CM | POA: Diagnosis not present

## 2022-03-26 DIAGNOSIS — L570 Actinic keratosis: Secondary | ICD-10-CM | POA: Diagnosis not present

## 2022-04-11 ENCOUNTER — Ambulatory Visit
Admit: 2022-04-11 | Discharge: 2022-04-11 | Payer: MEDICARE | Attending: Student in an Organized Health Care Education/Training Program | Primary: Student in an Organized Health Care Education/Training Program

## 2022-04-11 ENCOUNTER — Ambulatory Visit: Admit: 2022-04-11 | Discharge: 2022-04-11 | Payer: MEDICARE

## 2022-04-11 DIAGNOSIS — J310 Chronic rhinitis: Principal | ICD-10-CM

## 2022-04-11 DIAGNOSIS — J342 Deviated nasal septum: Secondary | ICD-10-CM | POA: Diagnosis not present

## 2022-04-11 DIAGNOSIS — E7800 Pure hypercholesterolemia, unspecified: Secondary | ICD-10-CM | POA: Diagnosis not present

## 2022-04-11 DIAGNOSIS — I10 Essential (primary) hypertension: Secondary | ICD-10-CM | POA: Diagnosis not present

## 2022-04-11 DIAGNOSIS — J324 Chronic pansinusitis: Secondary | ICD-10-CM | POA: Diagnosis not present

## 2022-04-11 DIAGNOSIS — J449 Chronic obstructive pulmonary disease, unspecified: Secondary | ICD-10-CM | POA: Diagnosis not present

## 2022-04-11 DIAGNOSIS — J343 Hypertrophy of nasal turbinates: Secondary | ICD-10-CM | POA: Diagnosis not present

## 2022-04-11 DIAGNOSIS — Z87891 Personal history of nicotine dependence: Secondary | ICD-10-CM | POA: Diagnosis not present

## 2022-04-11 DIAGNOSIS — Z885 Allergy status to narcotic agent status: Secondary | ICD-10-CM | POA: Diagnosis not present

## 2022-04-11 DIAGNOSIS — Z7982 Long term (current) use of aspirin: Secondary | ICD-10-CM | POA: Diagnosis not present

## 2022-07-02 ENCOUNTER — Ambulatory Visit: Admit: 2022-07-02 | Discharge: 2022-07-02 | Payer: MEDICARE | Attending: Internal Medicine | Primary: Internal Medicine

## 2022-07-02 ENCOUNTER — Ambulatory Visit: Admit: 2022-07-02 | Discharge: 2022-07-02 | Payer: MEDICARE

## 2022-07-02 DIAGNOSIS — Z23 Encounter for immunization: Principal | ICD-10-CM

## 2022-07-02 DIAGNOSIS — J988 Other specified respiratory disorders: Principal | ICD-10-CM

## 2022-07-02 DIAGNOSIS — B965 Pseudomonas (aeruginosa) (mallei) (pseudomallei) as the cause of diseases classified elsewhere: Principal | ICD-10-CM

## 2022-07-02 DIAGNOSIS — J479 Bronchiectasis, uncomplicated: Principal | ICD-10-CM

## 2022-07-02 DIAGNOSIS — R918 Other nonspecific abnormal finding of lung field: Principal | ICD-10-CM

## 2022-07-02 MED ORDER — ALBUTEROL SULFATE HFA 90 MCG/ACTUATION AEROSOL INHALER
RESPIRATORY_TRACT | 11 refills | 0 days | Status: CP | PRN
Start: 2022-07-02 — End: ?

## 2022-07-02 MED ORDER — AMOXICILLIN 875 MG-POTASSIUM CLAVULANATE 125 MG TABLET
ORAL_TABLET | Freq: Two times a day (BID) | ORAL | 0 refills | 14 days | Status: CP
Start: 2022-07-02 — End: 2022-07-16

## 2022-07-30 DIAGNOSIS — J479 Bronchiectasis, uncomplicated: Principal | ICD-10-CM

## 2022-09-04 DIAGNOSIS — H524 Presbyopia: Secondary | ICD-10-CM | POA: Diagnosis not present

## 2022-09-04 DIAGNOSIS — Z961 Presence of intraocular lens: Secondary | ICD-10-CM | POA: Diagnosis not present

## 2022-09-04 DIAGNOSIS — R7303 Prediabetes: Secondary | ICD-10-CM | POA: Diagnosis not present

## 2022-09-27 DIAGNOSIS — L821 Other seborrheic keratosis: Secondary | ICD-10-CM | POA: Diagnosis not present

## 2022-09-27 DIAGNOSIS — D225 Melanocytic nevi of trunk: Secondary | ICD-10-CM | POA: Diagnosis not present

## 2022-09-27 DIAGNOSIS — L570 Actinic keratosis: Secondary | ICD-10-CM | POA: Diagnosis not present

## 2022-09-27 DIAGNOSIS — Z85828 Personal history of other malignant neoplasm of skin: Secondary | ICD-10-CM | POA: Diagnosis not present

## 2022-09-27 DIAGNOSIS — D485 Neoplasm of uncertain behavior of skin: Secondary | ICD-10-CM | POA: Diagnosis not present

## 2022-09-27 DIAGNOSIS — C4362 Malignant melanoma of left upper limb, including shoulder: Secondary | ICD-10-CM | POA: Diagnosis not present

## 2022-09-27 DIAGNOSIS — L820 Inflamed seborrheic keratosis: Secondary | ICD-10-CM | POA: Diagnosis not present

## 2022-09-27 DIAGNOSIS — L814 Other melanin hyperpigmentation: Secondary | ICD-10-CM | POA: Diagnosis not present

## 2022-09-27 DIAGNOSIS — Z08 Encounter for follow-up examination after completed treatment for malignant neoplasm: Secondary | ICD-10-CM | POA: Diagnosis not present

## 2022-10-02 DIAGNOSIS — D0362 Melanoma in situ of left upper limb, including shoulder: Secondary | ICD-10-CM | POA: Diagnosis not present

## 2022-10-02 DIAGNOSIS — C4362 Malignant melanoma of left upper limb, including shoulder: Secondary | ICD-10-CM | POA: Diagnosis not present

## 2022-10-17 ENCOUNTER — Ambulatory Visit
Admit: 2022-10-17 | Discharge: 2022-10-18 | Payer: MEDICARE | Attending: Student in an Organized Health Care Education/Training Program | Primary: Student in an Organized Health Care Education/Training Program

## 2022-10-17 DIAGNOSIS — J310 Chronic rhinitis: Principal | ICD-10-CM

## 2022-12-22 DIAGNOSIS — J479 Bronchiectasis, uncomplicated: Principal | ICD-10-CM

## 2023-01-06 DIAGNOSIS — D485 Neoplasm of uncertain behavior of skin: Secondary | ICD-10-CM | POA: Diagnosis not present

## 2023-01-06 DIAGNOSIS — Z08 Encounter for follow-up examination after completed treatment for malignant neoplasm: Secondary | ICD-10-CM | POA: Diagnosis not present

## 2023-01-06 DIAGNOSIS — L814 Other melanin hyperpigmentation: Secondary | ICD-10-CM | POA: Diagnosis not present

## 2023-01-06 DIAGNOSIS — Z85820 Personal history of malignant melanoma of skin: Secondary | ICD-10-CM | POA: Diagnosis not present

## 2023-01-06 DIAGNOSIS — D0461 Carcinoma in situ of skin of right upper limb, including shoulder: Secondary | ICD-10-CM | POA: Diagnosis not present

## 2023-01-06 DIAGNOSIS — L728 Other follicular cysts of the skin and subcutaneous tissue: Secondary | ICD-10-CM | POA: Diagnosis not present

## 2023-01-06 DIAGNOSIS — L821 Other seborrheic keratosis: Secondary | ICD-10-CM | POA: Diagnosis not present

## 2023-01-06 DIAGNOSIS — L570 Actinic keratosis: Secondary | ICD-10-CM | POA: Diagnosis not present

## 2023-01-06 DIAGNOSIS — D225 Melanocytic nevi of trunk: Secondary | ICD-10-CM | POA: Diagnosis not present

## 2023-01-24 ENCOUNTER — Ambulatory Visit: Admit: 2023-01-24 | Discharge: 2023-01-24 | Payer: MEDICARE | Attending: Internal Medicine | Primary: Internal Medicine

## 2023-01-24 ENCOUNTER — Ambulatory Visit: Admit: 2023-01-24 | Discharge: 2023-01-24 | Payer: MEDICARE

## 2023-01-24 DIAGNOSIS — J479 Bronchiectasis, uncomplicated: Principal | ICD-10-CM

## 2023-01-24 DIAGNOSIS — Z79899 Other long term (current) drug therapy: Secondary | ICD-10-CM | POA: Diagnosis not present

## 2023-01-24 DIAGNOSIS — J449 Chronic obstructive pulmonary disease, unspecified: Secondary | ICD-10-CM | POA: Diagnosis not present

## 2023-01-24 DIAGNOSIS — Z87891 Personal history of nicotine dependence: Secondary | ICD-10-CM | POA: Diagnosis not present

## 2023-01-24 DIAGNOSIS — I10 Essential (primary) hypertension: Secondary | ICD-10-CM | POA: Diagnosis not present

## 2023-01-24 DIAGNOSIS — E7800 Pure hypercholesterolemia, unspecified: Secondary | ICD-10-CM | POA: Diagnosis not present

## 2023-01-24 MED ORDER — NEBULIZERS
3 refills | 0 days | Status: CP
Start: 2023-01-24 — End: ?

## 2023-01-24 MED ORDER — ALBUTEROL SULFATE 2.5 MG/3 ML (0.083 %) SOLUTION FOR NEBULIZATION
Freq: Two times a day (BID) | RESPIRATORY_TRACT | 3 refills | 90 days | Status: CP
Start: 2023-01-24 — End: 2024-01-24

## 2023-01-24 MED ORDER — SODIUM CHLORIDE 7 % FOR NEBULIZATION
Freq: Two times a day (BID) | RESPIRATORY_TRACT | 3 refills | 90 days | Status: CP
Start: 2023-01-24 — End: 2024-01-24

## 2023-01-29 DIAGNOSIS — D0461 Carcinoma in situ of skin of right upper limb, including shoulder: Secondary | ICD-10-CM | POA: Diagnosis not present

## 2023-01-29 DIAGNOSIS — R238 Other skin changes: Secondary | ICD-10-CM | POA: Diagnosis not present

## 2023-01-29 DIAGNOSIS — C44622 Squamous cell carcinoma of skin of right upper limb, including shoulder: Secondary | ICD-10-CM | POA: Diagnosis not present

## 2023-02-06 DIAGNOSIS — C61 Malignant neoplasm of prostate: Secondary | ICD-10-CM | POA: Diagnosis not present

## 2023-02-06 DIAGNOSIS — I10 Essential (primary) hypertension: Secondary | ICD-10-CM | POA: Diagnosis not present

## 2023-02-06 DIAGNOSIS — E785 Hyperlipidemia, unspecified: Secondary | ICD-10-CM | POA: Diagnosis not present

## 2023-02-13 DIAGNOSIS — I10 Essential (primary) hypertension: Secondary | ICD-10-CM | POA: Diagnosis not present

## 2023-02-13 DIAGNOSIS — Z1331 Encounter for screening for depression: Secondary | ICD-10-CM | POA: Diagnosis not present

## 2023-02-13 DIAGNOSIS — J449 Chronic obstructive pulmonary disease, unspecified: Secondary | ICD-10-CM | POA: Diagnosis not present

## 2023-02-13 DIAGNOSIS — Z0000 Encounter for general adult medical examination without abnormal findings: Secondary | ICD-10-CM | POA: Diagnosis not present

## 2023-02-13 DIAGNOSIS — C61 Malignant neoplasm of prostate: Secondary | ICD-10-CM | POA: Diagnosis not present

## 2023-02-13 DIAGNOSIS — E785 Hyperlipidemia, unspecified: Secondary | ICD-10-CM | POA: Diagnosis not present

## 2023-02-13 DIAGNOSIS — G4700 Insomnia, unspecified: Secondary | ICD-10-CM | POA: Diagnosis not present

## 2023-03-06 DIAGNOSIS — Z4802 Encounter for removal of sutures: Secondary | ICD-10-CM | POA: Diagnosis not present

## 2023-03-06 DIAGNOSIS — T8140XA Infection following a procedure, unspecified, initial encounter: Secondary | ICD-10-CM | POA: Diagnosis not present

## 2023-04-01 DIAGNOSIS — Z85828 Personal history of other malignant neoplasm of skin: Secondary | ICD-10-CM | POA: Diagnosis not present

## 2023-04-01 DIAGNOSIS — L218 Other seborrheic dermatitis: Secondary | ICD-10-CM | POA: Diagnosis not present

## 2023-04-01 DIAGNOSIS — D225 Melanocytic nevi of trunk: Secondary | ICD-10-CM | POA: Diagnosis not present

## 2023-04-01 DIAGNOSIS — L814 Other melanin hyperpigmentation: Secondary | ICD-10-CM | POA: Diagnosis not present

## 2023-04-01 DIAGNOSIS — Z85820 Personal history of malignant melanoma of skin: Secondary | ICD-10-CM | POA: Diagnosis not present

## 2023-04-01 DIAGNOSIS — Z86007 Personal history of in-situ neoplasm of skin: Secondary | ICD-10-CM | POA: Diagnosis not present

## 2023-04-01 DIAGNOSIS — Z08 Encounter for follow-up examination after completed treatment for malignant neoplasm: Secondary | ICD-10-CM | POA: Diagnosis not present

## 2023-04-01 DIAGNOSIS — L728 Other follicular cysts of the skin and subcutaneous tissue: Secondary | ICD-10-CM | POA: Diagnosis not present

## 2023-04-01 DIAGNOSIS — L821 Other seborrheic keratosis: Secondary | ICD-10-CM | POA: Diagnosis not present

## 2023-04-29 ENCOUNTER — Ambulatory Visit: Admit: 2023-04-29 | Discharge: 2023-04-29 | Payer: MEDICARE

## 2023-04-29 ENCOUNTER — Ambulatory Visit: Admit: 2023-04-29 | Discharge: 2023-04-29 | Payer: MEDICARE | Attending: Internal Medicine | Primary: Internal Medicine

## 2023-04-29 DIAGNOSIS — J449 Chronic obstructive pulmonary disease, unspecified: Principal | ICD-10-CM

## 2023-04-29 DIAGNOSIS — J479 Bronchiectasis, uncomplicated: Principal | ICD-10-CM

## 2023-04-29 DIAGNOSIS — B965 Pseudomonas (aeruginosa) (mallei) (pseudomallei) as the cause of diseases classified elsewhere: Secondary | ICD-10-CM | POA: Diagnosis not present

## 2023-04-29 DIAGNOSIS — J988 Other specified respiratory disorders: Secondary | ICD-10-CM | POA: Diagnosis not present

## 2023-04-29 MED ORDER — NEBULIZER ACCESSORIES KIT
PACK | 3 refills | 0 days | Status: CP
Start: 2023-04-29 — End: ?

## 2023-04-29 MED ORDER — SODIUM CHLORIDE 3 % FOR NEBULIZATION
Freq: Two times a day (BID) | RESPIRATORY_TRACT | 3 refills | 90 days | Status: CP
Start: 2023-04-29 — End: 2024-04-28

## 2023-05-06 DIAGNOSIS — J479 Bronchiectasis, uncomplicated: Secondary | ICD-10-CM | POA: Diagnosis not present

## 2023-05-14 DIAGNOSIS — I10 Essential (primary) hypertension: Secondary | ICD-10-CM | POA: Diagnosis not present

## 2023-05-14 DIAGNOSIS — J449 Chronic obstructive pulmonary disease, unspecified: Secondary | ICD-10-CM | POA: Diagnosis not present

## 2023-05-14 DIAGNOSIS — C61 Malignant neoplasm of prostate: Secondary | ICD-10-CM | POA: Diagnosis not present

## 2023-05-14 DIAGNOSIS — E785 Hyperlipidemia, unspecified: Secondary | ICD-10-CM | POA: Diagnosis not present

## 2023-05-14 DIAGNOSIS — G4700 Insomnia, unspecified: Secondary | ICD-10-CM | POA: Diagnosis not present

## 2023-06-24 DIAGNOSIS — J479 Bronchiectasis, uncomplicated: Principal | ICD-10-CM

## 2023-06-24 DIAGNOSIS — J449 Chronic obstructive pulmonary disease, unspecified: Principal | ICD-10-CM

## 2023-07-18 ENCOUNTER — Inpatient Hospital Stay: Admit: 2023-07-18 | Discharge: 2023-07-18 | Payer: MEDICARE

## 2023-07-18 ENCOUNTER — Ambulatory Visit: Admit: 2023-07-18 | Discharge: 2023-07-18 | Payer: MEDICARE | Attending: Internal Medicine | Primary: Internal Medicine

## 2023-07-18 DIAGNOSIS — J449 Chronic obstructive pulmonary disease, unspecified: Principal | ICD-10-CM

## 2023-07-18 DIAGNOSIS — J479 Bronchiectasis, uncomplicated: Principal | ICD-10-CM

## 2023-07-18 DIAGNOSIS — B965 Pseudomonas (aeruginosa) (mallei) (pseudomallei) as the cause of diseases classified elsewhere: Principal | ICD-10-CM

## 2023-07-18 DIAGNOSIS — J988 Other specified respiratory disorders: Principal | ICD-10-CM

## 2023-07-18 MED ORDER — ALBUTEROL SULFATE 2.5 MG/3 ML (0.083 %) SOLUTION FOR NEBULIZATION
Freq: Two times a day (BID) | RESPIRATORY_TRACT | 3 refills | 90.00 days | Status: CP
Start: 2023-07-18 — End: 2024-07-17

## 2023-07-18 MED ORDER — SODIUM CHLORIDE 7 % FOR NEBULIZATION
Freq: Two times a day (BID) | RESPIRATORY_TRACT | 3 refills | 90.00 days | Status: CP
Start: 2023-07-18 — End: 2024-07-17

## 2023-07-18 MED ORDER — ALBUTEROL SULFATE HFA 90 MCG/ACTUATION AEROSOL INHALER
RESPIRATORY_TRACT | 11 refills | 0.00 days | Status: CP | PRN
Start: 2023-07-18 — End: ?

## 2023-07-18 MED ORDER — NEBULIZER ACCESSORIES KIT
PACK | 3 refills | 0.00 days | Status: CP
Start: 2023-07-18 — End: ?

## 2023-11-11 ENCOUNTER — Inpatient Hospital Stay: Admit: 2023-11-11 | Discharge: 2023-11-11 | Payer: Medicare (Managed Care)

## 2023-11-11 ENCOUNTER — Ambulatory Visit
Admit: 2023-11-11 | Discharge: 2023-11-11 | Payer: Medicare (Managed Care) | Attending: Internal Medicine | Primary: Internal Medicine

## 2023-11-11 DIAGNOSIS — J479 Bronchiectasis, uncomplicated: Principal | ICD-10-CM

## 2023-11-11 DIAGNOSIS — J449 Chronic obstructive pulmonary disease, unspecified: Principal | ICD-10-CM

## 2023-11-18 ENCOUNTER — Encounter: Admit: 2023-11-18 | Discharge: 2023-11-19 | Payer: Medicare (Managed Care)

## 2023-11-18 DIAGNOSIS — J449 Chronic obstructive pulmonary disease, unspecified: Principal | ICD-10-CM

## 2023-11-18 DIAGNOSIS — J479 Bronchiectasis, uncomplicated: Principal | ICD-10-CM

## 2023-11-20 DIAGNOSIS — J449 Chronic obstructive pulmonary disease, unspecified: Principal | ICD-10-CM

## 2023-11-20 DIAGNOSIS — J479 Bronchiectasis, uncomplicated: Principal | ICD-10-CM

## 2023-11-20 DIAGNOSIS — R0902 Hypoxemia: Principal | ICD-10-CM

## 2024-01-14 DIAGNOSIS — J479 Bronchiectasis, uncomplicated: Principal | ICD-10-CM

## 2024-01-14 MED ORDER — ALBUTEROL SULFATE 2.5 MG/3 ML (0.083 %) SOLUTION FOR NEBULIZATION
3 refills | 0.00000 days
Start: 2024-01-14 — End: ?

## 2024-01-15 MED ORDER — ALBUTEROL SULFATE 2.5 MG/3 ML (0.083 %) SOLUTION FOR NEBULIZATION
RESPIRATORY_TRACT | 3 refills | 0.00000 days | Status: CP
Start: 2024-01-15 — End: ?

## 2024-04-27 ENCOUNTER — Ambulatory Visit
Admit: 2024-04-27 | Discharge: 2024-04-27 | Payer: Medicare (Managed Care) | Attending: Internal Medicine | Primary: Internal Medicine

## 2024-04-27 ENCOUNTER — Inpatient Hospital Stay: Admit: 2024-04-27 | Discharge: 2024-04-27 | Payer: Medicare (Managed Care)

## 2024-04-27 DIAGNOSIS — B965 Pseudomonas (aeruginosa) (mallei) (pseudomallei) as the cause of diseases classified elsewhere: Principal | ICD-10-CM

## 2024-04-27 DIAGNOSIS — J479 Bronchiectasis, uncomplicated: Principal | ICD-10-CM

## 2024-04-27 DIAGNOSIS — J988 Other specified respiratory disorders: Principal | ICD-10-CM

## 2024-04-27 DIAGNOSIS — J449 Chronic obstructive pulmonary disease, unspecified: Principal | ICD-10-CM

## 2024-04-27 MED ORDER — BRENSOCATIB 25 MG TABLET
ORAL_TABLET | Freq: Every day | ORAL | 3 refills | 90.00000 days | Status: CP
Start: 2024-04-27 — End: 2025-04-27

## 2024-04-27 MED ORDER — ALBUTEROL SULFATE 2.5 MG/3 ML (0.083 %) SOLUTION FOR NEBULIZATION
Freq: Two times a day (BID) | RESPIRATORY_TRACT | 3 refills | 90.00000 days | Status: CP
Start: 2024-04-27 — End: 2025-04-27

## 2024-04-27 MED ORDER — ALBUTEROL SULFATE HFA 90 MCG/ACTUATION AEROSOL INHALER
RESPIRATORY_TRACT | 11 refills | 0.00000 days | Status: CP | PRN
Start: 2024-04-27 — End: ?
  Filled 2024-05-06: qty 540, 90d supply, fill #0

## 2024-04-27 MED ORDER — NEBULIZER ACCESSORIES KIT
PACK | 3 refills | 0.00000 days | Status: CP
Start: 2024-04-27 — End: ?

## 2024-04-27 MED ORDER — SODIUM CHLORIDE 7 % FOR NEBULIZATION
Freq: Two times a day (BID) | RESPIRATORY_TRACT | 3 refills | 90.00000 days | Status: CP
Start: 2024-04-27 — End: 2025-04-27
  Filled 2024-05-06: qty 720, 90d supply, fill #0

## 2024-04-28 DIAGNOSIS — J479 Bronchiectasis, uncomplicated: Principal | ICD-10-CM

## 2024-04-30 DIAGNOSIS — J479 Bronchiectasis, uncomplicated: Principal | ICD-10-CM

## 2024-05-06 MED FILL — ALBUTEROL SULFATE HFA 90 MCG/ACTUATION AEROSOL INHALER: RESPIRATORY_TRACT | 28 days supply | Qty: 18 | Fill #0

## 2024-05-06 MED FILL — LC PLUS MISC: 84 days supply | Qty: 3 | Fill #0

## 2024-05-10 NOTE — Progress Notes (Signed)
 [facility] Specialty Medication Onboarding    Specialty Medication: [place]  Prior Authorization: Approved   Financial Assistance: No - copay  <$25  Copay/Day Supply: $0 / 30    Insurance Restrictions: None     Notes to Pharmacist: None  Credit Card on File: not applicable  Delivery Method (based on home address currently on file): Courier      The triage team has completed the benefits investigation and has determined that the patient is able to fill this medication at [facility]. Please contact the patient to complete the onboarding or follow up with the prescribing physician as needed.

## 2024-05-12 NOTE — Progress Notes (Signed)
 [facility]    Patient Onboarding/Medication Counseling    Logan Sullivan is a 83 y.o. male with bronchiectasis who I am counseling today on initiation of therapy.  I am speaking to the patient.    Was a nurse, learning disability used for this call? No    Verified patient's date of birth / HIPAA.    Specialty medication(s) to be sent: CF/Pulmonary/Asthma: Brinsupri      Non-specialty medications/supplies to be sent: n/a      Medications not needed at this time: n/a       The patient declined counseling on side effects and monitoring parameters because they were counseled in clinic. The information in the declined sections below are for informational purposes only and was not discussed with patient.       Brinsupri (Brensocatib)    Medication & Administration     Dosage:  Take 1 tablet (25 mg) by mouth daily    Administration:   Administer orally at the same time each day.   Swallow tablets whole. Do not split, crush or chew.  May be administered with or without food.    Adherence/Missed dose instructions: Skip the missed dose and go back to your normal time. Do not take 2 doses at the same time or extra doses.    Goals of Therapy     Reduce risk of non-cystic fibrosis bronchiectasis flares  Reduce lung function decline (with 25 mg dosing only)    Side Effects & Monitoring Parameters     Commonly reported side effects  Signs of a common cold like sore throat or runny nose  Headache  Skin problems like dry skin or small areas of thick skin is common. Talk with doctor regarding any new skin changes.    The following side effects should be reported to the provider:  Signs of an allergic reaction, like rash; hives; itching; red, swollen, blistered, or peeling skin with or without fever; wheezing; tightness in the chest or throat; trouble breathing, swallowing, or talking; unusual hoarseness; or swelling of the mouth, face, lips, tongue, or throat.  Signs of high blood pressure like very bad headache or dizziness, passing out, or change in eyesight.  Gum or teeth changes  New rashes or skin conditions     Monitoring Parameters: It is recommended that patients perform routine dental hygiene and have regular dental checkups while taking this medicine.    Contraindications, Warnings, & Precautions     Reproductive considerations: In animal reproduction studies, no adverse developmental effects were observed with oral brensocatib administered during organogenesis at exposures <=20 times the maximum recommended human dose (MRHD).   Breastfeeding considerations: It is not known if brensocatib is present in breast milk. According to the manufacturer, the decision to breastfeed during therapy should consider the risk of infant exposure, the benefits of breastfeeding to the infant, and benefits of treatment to the mother.  Vaccines: Avoid use of live attenuated vaccines during treatment; safety or effectiveness with concomitant use is unknown.     Drug/Food Interactions     Medication list reviewed in Epic. The patient was instructed to inform the care team before taking any new medications or supplements. No drug interactions identified.     Storage, Handling Precautions, & Disposal     Store in original container at room temperature in a dry place.  Do not store in a bathroom.   Keep the lid tightly closed. Keep out of the reach of children and pets.  Do not  flush down a toilet or pour down a drain unless you are told to do so.        Current Medications (including OTC/herbals), Comorbidities and Allergies     Current Medications[1]    Allergies[2]    Problem List[3]    Medication list has been reviewed and updated in Epic: Yes    Allergies have been reviewed and updated in Epic: Yes    Appropriateness of Therapy     Acute infections noted within Epic:  No active infections  Patient reported infection: productive cough - patient reported to provider    Is the medication and dose appropriate considering the patient???s diagnosis, treatment, and disease journey, comorbidities, medical history, current medications, allergies, therapeutic goals, self-administration ability, and access barriers? Yes    Prescription has been clinically reviewed: Yes      Baseline Quality of Life Assessment      How many days over the past month did your bronchiectasis  keep you from your normal activities? For example, brushing your teeth or getting up in the morning. Sometimes - avoids stairs due to it causing increased SOB     Financial Information     Medication Assistance provided: Prior Authorization    Anticipated copay of $0 reviewed with patient. Verified delivery address.    Delivery Information     Scheduled delivery date: 05/14/24    Expected start date: ~11/29    Medication will be delivered via Next Day Courier to the prescription address in [facility].  This shipment will not require a signature.      Explained the services we provide at [facility] and that each month we would call to set up refills.  Stressed importance of returning phone calls so that we could ensure they receive their medications in time each month.  Informed patient that we should be setting up refills 7-10 days prior to when they will run out of medication.  A pharmacist will reach out to perform a clinical assessment periodically.  Informed patient that a welcome packet, containing information about our pharmacy and other support services, a Notice of Privacy Practices, and a drug information handout will be sent.      The patient or caregiver noted above participated in the development of this care plan and knows that they can request review of or adjustments to the care plan at any time.      Patient or caregiver verbalized understanding of the above information as well as how to contact the pharmacy at [phone] option 4 with any questions/concerns.  The pharmacy is open Monday through Friday 8:30am-4:30pm.  A pharmacist is available 24/7 via pager to answer any clinical questions they may have.    Patient Specific Needs     Does the patient have any physical, cognitive, or cultural barriers? No    Does the patient have adequate living arrangements? (i.e. the ability to store and take their medication appropriately) Yes    Did you identify any home environmental safety or security hazards? No    Patient prefers to have medications discussed with  Patient     Is the patient or caregiver able to read and understand education materials at a high school level or above? Yes    Patient's primary language is  English     Is the patient high risk? No    Does the patient have an additional or emergency contact listed in their chart? Yes    SOCIAL DETERMINANTS  OF HEALTH     At the [facility], we have learned that life circumstances - like trouble affording food, housing, utilities, or transportation can affect the health of many of our patients.   That is why we wanted to ask: are you currently experiencing any life circumstances that are negatively impacting your health and/or quality of life? Patient declined to answer    Social Drivers of Health     Food Insecurity: Not on file   Tobacco Use: Medium Risk (04/27/2024)    Patient History     Smoking Tobacco Use: Former     Smokeless Tobacco Use: Never     Passive Exposure: Never   Transportation Needs: Not on file   Alcohol Use: Not on file   Housing: Not on file   Physical Activity: Not on file   Utilities: Not on file   Stress: Not on file   Interpersonal Safety: Not on file   Substance Use: Not on file (04/28/2023)   Intimate Partner Violence: Not on file   Social Connections: Not on file   Financial Resource Strain: Not on file   Health Literacy: Not on file   Internet Connectivity: Not on file       Would you be willing to receive help with any of the needs that you have identified today? Not applicable       Shelba DELENA Hummer, PharmD  [facility] Specialty Pharmacist [1]   Current Outpatient Medications   Medication Sig Dispense Refill    albuterol  2.5 mg /3 mL (0.083 %) nebulizer solution Inhale 3 mL (2.5 mg total) by nebulization two (2) times a day. 540 mL 3    albuterol  HFA 90 mcg/actuation inhaler Inhale 2 puffs every four (4) hours as needed for wheezing or shortness of breath. 18 g 11    amLODIPine (NORVASC) 5 MG tablet Take 1 tablet (5 mg total) by mouth daily.      aspirin (ECOTRIN) 81 MG tablet Take 1 tablet (81 mg total) by mouth in the morning.      brensocatib 25 mg tablet Take 1 tablet (25 mg total) by mouth daily. 90 tablet 3    chlorhexidine (PERIDEX) 0.12 % solution SWISH WITH 1/2 OUNCE FOR 30 SECONDS AND SPIT OUT. USE TWICE DAILY (AFTER BREAKFAST AND BEFORE BED TIME)      cholecalciferol, vitamin D3, 50 mcg (2,000 unit) TbDL Take 2,000 Units by mouth in the morning.      coenzyme Q10 200 mg capsule Take 300 mg by mouth daily.      cyanocobalamin, vitamin B-12, 2,500 mcg Chew Take 1,000 mcg by mouth daily.      losartan (COZAAR) 100 MG tablet Take 1 tablet (100 mg total) by mouth in the morning.      mucus clearing device (AEROBIKA OSCILLATING PEP GARLAND French NDC: [id number]; Aerobik and Aeroeclipse.  Use twice a day for airway clearance.  Dx. Bronchiectasis. 1 Device 0    nebulizers (LC PLUS) Misc Use as directed 3 each 3    nebulizers (LC PLUS) Misc Use as directed with nebulized medications 3 each 3    pravastatin (PRAVACHOL) 20 MG tablet Take 1 tablet (20 mg total) by mouth in the morning.      sodium chloride  7% 7 % Nebu Inhale 4 mL by nebulization two (2) times a day. 720 mL 3     No current facility-administered medications for this visit.   [2]   Allergies  Allergen Reactions    Hydrocodone-Acetaminophen Nausea And Vomiting    Bupropion Confusion and Other (See Comments)    Simvastatin Confusion   [3]   Patient Active Problem List  Diagnosis    Bronchiectasis without complication    (CMS-HCC)    Chronic obstructive pulmonary disease (CMS-HCC) Multiple pulmonary nodules    Pseudomonas respiratory infection    Chronic sinusitis    Chronic rhinitis    Exercise hypoxemia

## 2024-05-13 MED FILL — BRINSUPRI 25 MG TABLET: ORAL | 30 days supply | Qty: 30 | Fill #0

## 2024-05-14 NOTE — Telephone Encounter (Signed)
 Called patient to let him know that we got the results of his overnight oximetry.  He does need to continue using oxygen at night.  Patient stated he is going out of town for a week and will not be taking oxygen with him.  Encouraged him to take it and use it each night.  Patient stated he would likely not.

## 2024-06-01 NOTE — Progress Notes (Signed)
 [facility] Clinical Assessment & Refill Coordination Note    Logan Sullivan, DOB: Feb 16, 1941  Phone: [phone] (home)     All above HIPAA information was verified with patient.     Was a nurse, learning disability used for this call? No    Specialty Medication(s):   CF/Pulmonary/Asthma: Brinsupri     Current Medications[1]     Changes to medications: Firas reports no changes at this time.    Medication list has been reviewed and updated in Epic: Yes    Allergies[2]    Changes to allergies: No    Allergies have been reviewed and updated in Epic: Yes    SPECIALTY MEDICATION ADHERENCE     Brinsupri 25 mg: 15 doses of medicine on hand   Medication Adherence    Patient reported X missed doses in the last month: 0  Specialty Medication: Brinsupri  Patient is on additional specialty medications: No  Patient is on more than two specialty medications: No  Any gaps in refill history greater than 2 weeks in the last 3 months: no  Demonstrates understanding of importance of adherence: yes  Informant: patient          Specialty medication(s) dose(s) confirmed: Regimen is correct and unchanged.     Are there any concerns with adherence? No    Adherence counseling provided? Not needed    CLINICAL MANAGEMENT AND INTERVENTION      Clinical Benefit Assessment:    Do you feel the medicine is effective or helping your condition? Somewhat, patient did not start medication until ~11/28 due to vacation. While out of town he was without his nightly oxygen and stayed at a house with steps. He has been back to his usual routine for ~1 week and is feeling better. Thinks he will notice more benefit as he continues his usual routine.    Clinical Benefit counseling provided? Not needed    Adverse Effects Assessment:    Are you experiencing any side effects? No    Are you experiencing difficulty administering your medicine? No    Quality of Life Assessment:    Quality of Life    Rheumatology  Oncology  Dermatology  Cystic Fibrosis          How many days over the past month did your Bronchiectais  keep you from your normal activities? For example, brushing your teeth or getting up in the morning. Almost daily, difficulty with steps    Have you discussed this with your provider? Yes    Acute Infection Status:    Acute infections noted within Epic:  No active infections    Patient reported infection: None    Therapy Appropriateness:    Is the medication and dose appropriate considering the patient???s diagnosis, treatment, and disease journey, comorbidities, medical history, current medications, allergies, therapeutic goals, self-administration ability, and access barriers? Yes, therapy is appropriate and should be continued     Clinical Intervention:    Was an intervention completed as part of this clinical assessment? No    DISEASE/MEDICATION-SPECIFIC INFORMATION      N/A    Other Pulmonary Conditions: Not Applicable    PATIENT SPECIFIC NEEDS     Does the patient have any physical, cognitive, or cultural barriers? No    Is the patient high risk? No    Does the patient require physician intervention or other additional services (i.e., nutrition, smoking cessation, social work)? No    Does the patient have an additional or emergency contact  listed in their chart? Yes    SOCIAL DETERMINANTS OF HEALTH     At the [facility], we have learned that life circumstances - like trouble affording food, housing, utilities, or transportation can affect the health of many of our patients.   That is why we wanted to ask: are you currently experiencing any life circumstances that are negatively impacting your health and/or quality of life? Patient declined to answer    Social Drivers of Health     Food Insecurity: Not on file   Tobacco Use: Medium Risk (04/27/2024)    Patient History     Smoking Tobacco Use: Former     Smokeless Tobacco Use: Never     Passive Exposure: Never   Transportation Needs: Not on file   Alcohol Use: Not on file   Housing: Not on file   Physical Activity: Not on file   Utilities: Not on file   Stress: Not on file   Interpersonal Safety: Not on file   Substance Use: Not on file (04/28/2023)   Intimate Partner Violence: Not on file   Social Connections: Not on file   Financial Resource Strain: Not on file   Health Literacy: Not on file   Internet Connectivity: Not on file       Would you be willing to receive help with any of the needs that you have identified today? Not applicable       SHIPPING     Specialty Medication(s) to be Shipped:   CF/Pulmonary/Asthma: Brinsupri    Other medication(s) to be shipped: No additional medications requested for fill at this time    Specialty Medications not needed at this time: N/A     Changes to insurance: No    Cost and Payment: Patient has a $0 copay, payment information is not required.    Delivery Scheduled: Yes, Expected medication delivery date: 12/18.     Medication will be delivered via Next Day Courier to the confirmed prescription address in [facility].    The patient will receive a drug information handout for each medication shipped and additional FDA Medication Guides as required.  Verified that patient has previously received a Conservation Officer, Historic Buildings and a Surveyor, Mining.    The patient or caregiver noted above participated in the development of this care plan and knows that they can request review of or adjustments to the care plan at any time.      All of the patient's questions and concerns have been addressed.    Lauraine Lewis, PharmD   [facility] Specialty Pharmacist       [1]   Current Outpatient Medications   Medication Sig Dispense Refill    albuterol  2.5 mg /3 mL (0.083 %) nebulizer solution Inhale 3 mL (2.5 mg total) by nebulization two (2) times a day. 540 mL 3    albuterol  HFA 90 mcg/actuation inhaler Inhale 2 puffs every four (4) hours as needed for wheezing or shortness of breath. 18 g 11    amLODIPine (NORVASC) 5 MG tablet Take 1 tablet (5 mg total) by mouth daily.      aspirin (ECOTRIN) 81 MG tablet Take 1 tablet (81 mg total) by mouth in the morning.      brensocatib 25 mg tablet Take 1 tablet (25 mg total) by mouth daily. 90 tablet 3    chlorhexidine (PERIDEX) 0.12 % solution SWISH WITH 1/2 OUNCE FOR 30 SECONDS AND SPIT OUT. USE TWICE DAILY (  AFTER BREAKFAST AND BEFORE BED TIME)      cholecalciferol, vitamin D3, 50 mcg (2,000 unit) TbDL Take 2,000 Units by mouth in the morning.      coenzyme Q10 200 mg capsule Take 300 mg by mouth daily.      cyanocobalamin, vitamin B-12, 2,500 mcg Chew Take 1,000 mcg by mouth daily.      losartan (COZAAR) 100 MG tablet Take 1 tablet (100 mg total) by mouth in the morning.      mucus clearing device (AEROBIKA OSCILLATING PEP GARLAND French NDC: [id number]; Aerobik and Aeroeclipse.  Use twice a day for airway clearance.  Dx. Bronchiectasis. 1 Device 0    nebulizers (LC PLUS) Misc Use as directed 3 each 3    nebulizers (LC PLUS) Misc Use as directed with nebulized medications 3 each 3    pravastatin (PRAVACHOL) 20 MG tablet Take 1 tablet (20 mg total) by mouth in the morning.      sodium chloride  7% 7 % Nebu Inhale 4 mL by nebulization two (2) times a day. 720 mL 3     No current facility-administered medications for this visit.   [2]   Allergies  Allergen Reactions    Hydrocodone-Acetaminophen Nausea And Vomiting    Bupropion Confusion and Other (See Comments)    Simvastatin Confusion

## 2024-06-09 MED FILL — BRINSUPRI 25 MG TABLET: ORAL | 30 days supply | Qty: 30 | Fill #1

## 2024-07-11 NOTE — Progress Notes (Signed)
 [facility] Refill Coordination Note    Logan Sullivan, DOB: August 10, 1940  Phone: [phone] (home)       All above HIPAA information was verified with patient.         07/09/2024    11:04 AM   Specialty Rx Medication Refill Questionnaire   Which Medications would you like refilled and shipped? BRINSUPRI  25 MG, Currently I have 11 day's supply.   Please list all current allergies: NONE   Have you missed any doses in the last 30 days? No   Have you had any changes to your medication(s) since your last refill? No   How much of each medication do you have remaining at home? (eg. number of tablets, injections, etc.) Minimum 13 day's with refills from [facility] [place]   Have you experienced any side effects in the last 30 days? No   Please enter the full address (street address, city, state, zip code) where you would like your medication(s) to be delivered to. [address] [place], [place] [address]   Please specify on which day you would like your medication(s) to arrive. Note: if you need your medication(s) within 3 days, please call the pharmacy to schedule your order at [phone]  07/16/2024   Has your insurance changed since your last refill? No   Would you like a pharmacist to call you to discuss your medication(s)? No   Do you require a signature for your package? (Note: if we are billing Medicare Part B or your order contains a controlled substance, we will require a signature) No   I have been provided my out of pocket cost for my medication and approve the pharmacy to charge the amount to my credit card on file. Yes   Additional Comments: If 07/16/2024 is not convenient for your delivery, I am also available 07/19/2024 between 8-12 am both days. Please let me know your choice of date. If courier calls [phone], I will meet the individual at the entrance to the building and save them a trip through the sign-in hassle. Thank you. Have a great weekend!         Completed refill call assessment today to schedule patient's medication shipment from the [facility] [phone]).  All relevant notes have been reviewed.       Confirmed patient received a Conservation Officer, Historic Buildings and a Surveyor, Mining with first shipment. The patient will receive a drug information handout for each medication shipped and additional FDA Medication Guides as required.         REFERRAL TO PHARMACIST     Referral to the pharmacist: Not needed      [facility]     Shipping address confirmed in Epic.     Delivery Scheduled: Yes, Expected medication delivery date: 07/16/24 .     Medication will be delivered via Next Day Courier to the prescription address in Epic [facility].    Tawni Daring   [facility] Specialty Technician

## 2024-07-15 MED FILL — BRINSUPRI 25 MG TABLET: ORAL | 30 days supply | Qty: 30 | Fill #2
